# Patient Record
Sex: Female | Born: 1980 | Hispanic: No | Marital: Married | Smoking: Never smoker
Health system: Southern US, Community
[De-identification: ages and names within clinical notes are randomized; demographics above are authoritative.]

## PROBLEM LIST (undated history)

## (undated) ENCOUNTER — Inpatient Hospital Stay (HOSPITAL_COMMUNITY): Payer: Self-pay

## (undated) DIAGNOSIS — T4145XA Adverse effect of unspecified anesthetic, initial encounter: Secondary | ICD-10-CM

## (undated) DIAGNOSIS — Z789 Other specified health status: Secondary | ICD-10-CM

## (undated) DIAGNOSIS — T8859XA Other complications of anesthesia, initial encounter: Secondary | ICD-10-CM

## (undated) HISTORY — DX: Other complications of anesthesia, initial encounter: T88.59XA

## (undated) HISTORY — DX: Adverse effect of unspecified anesthetic, initial encounter: T41.45XA

---

## 2016-09-22 ENCOUNTER — Encounter (HOSPITAL_COMMUNITY): Payer: Self-pay

## 2016-09-22 ENCOUNTER — Inpatient Hospital Stay (HOSPITAL_COMMUNITY)
Admission: AD | Admit: 2016-09-22 | Discharge: 2016-09-22 | Disposition: A | Discharge status: Home / Self Care | Payer: Self-pay | Source: Ambulatory Visit | Attending: Obstetrics & Gynecology | Admitting: Obstetrics & Gynecology

## 2016-09-22 DIAGNOSIS — O26893 Other specified pregnancy related conditions, third trimester: Secondary | ICD-10-CM | POA: Insufficient documentation

## 2016-09-22 DIAGNOSIS — M545 Low back pain: Secondary | ICD-10-CM | POA: Insufficient documentation

## 2016-09-22 DIAGNOSIS — O34219 Maternal care for unspecified type scar from previous cesarean delivery: Secondary | ICD-10-CM

## 2016-09-22 DIAGNOSIS — O34211 Maternal care for low transverse scar from previous cesarean delivery: Secondary | ICD-10-CM | POA: Insufficient documentation

## 2016-09-22 DIAGNOSIS — O9989 Other specified diseases and conditions complicating pregnancy, childbirth and the puerperium: Secondary | ICD-10-CM

## 2016-09-22 DIAGNOSIS — M549 Dorsalgia, unspecified: Secondary | ICD-10-CM

## 2016-09-22 DIAGNOSIS — O99891 Other specified diseases and conditions complicating pregnancy: Secondary | ICD-10-CM

## 2016-09-22 DIAGNOSIS — Z3A30 30 weeks gestation of pregnancy: Secondary | ICD-10-CM | POA: Insufficient documentation

## 2016-09-22 HISTORY — DX: Other specified health status: Z78.9

## 2016-09-22 LAB — URINALYSIS, ROUTINE W REFLEX MICROSCOPIC
Bilirubin Urine: NEGATIVE
GLUCOSE, UA: NEGATIVE mg/dL
Hgb urine dipstick: NEGATIVE
KETONES UR: NEGATIVE mg/dL
LEUKOCYTES UA: NEGATIVE
NITRITE: NEGATIVE
Protein, ur: NEGATIVE mg/dL
Specific Gravity, Urine: 1.025 (ref 1.005–1.030)
pH: 5 (ref 5.0–8.0)

## 2016-09-22 NOTE — MAU Note (Addendum)
G4P3 @ 30 wks. Previous c/s x3 "cervix thick" according to patient. Denies LOF or bleeding. Presents to triage for back pain for 4 days. VSS see flow sheet for details Urine result pending and sent to lab by OB tech  1937: Provider at bs for SVE with OB tech. SVE closed.

## 2016-09-22 NOTE — Discharge Instructions (Signed)
Back Pain in Pregnancy Introduction Back pain during pregnancy is common. Back pain may be caused by several factors that are related to changes during your pregnancy. Follow these instructions at home: Managing pain, stiffness, and swelling  If directed, apply ice for sudden (acute) back pain.  Put ice in a plastic bag.  Place a towel between your skin and the bag.  Leave the ice on for 20 minutes, 2-3 times per day.  If directed, apply heat to the affected area before you exercise:  Place a towel between your skin and the heat pack or heating pad.  Leave the heat on for 20-30 minutes.  Remove the heat if your skin turns bright red. This is especially important if you are unable to feel pain, heat, or cold. You may have a greater risk of getting burned. Activity  Exercise as told by your health care provider. Exercising is the best way to prevent or manage back pain.  Listen to your body when lifting. If lifting hurts, ask for help or bend your knees. This uses your leg muscles instead of your back muscles.  Squat down when picking up something from the floor. Do not bend over.  Only use bed rest as told by your health care provider. Bed rest should only be used for the most severe episodes of back pain. Standing, Sitting, and Lying Down  Do not stand in one place for long periods of time.  Use good posture when sitting. Make sure your head rests over your shoulders and is not hanging forward. Use a pillow on your lower back if necessary.  Try sleeping on your side, preferably the left side, with a pillow or two between your legs. If you are sore after a night's rest, your bed may be too soft. A firm mattress may provide more support for your back during pregnancy. General instructions  Do not wear high heels.  Eat a healthy diet. Try to gain weight within your health care provider's recommendations.  Use a maternity girdle, elastic sling, or back brace as told by your  health care provider.  Take over-the-counter and prescription medicines only as told by your health care provider.  Keep all follow-up visits as told by your health care provider. This is important. This includes any visits with any specialists, such as a physical therapist. Contact a health care provider if:  Your back pain interferes with your daily activities.  You have increasing pain in other parts of your body. Get help right away if:  You develop numbness, tingling, weakness, or problems with the use of your arms or legs.  You develop severe back pain that is not controlled with medicine.  You have a sudden change in bowel or bladder control.  You develop shortness of breath, dizziness, or you faint.  You develop nausea, vomiting, or sweating.  You have back pain that is a rhythmic, cramping pain similar to labor pains. Labor pain is usually 1-2 minutes apart, lasts for about 1 minute, and involves a bearing down feeling or pressure in your pelvis.  You have back pain and your water breaks or you have vaginal bleeding.  You have back pain or numbness that travels down your leg.  Your back pain developed after you fell.  You develop pain on one side of your back.  You see blood in your urine.  You develop skin blisters in the area of your back pain. This information is not intended to replace advice given to   you by your health care provider. Make sure you discuss any questions you have with your health care provider. Document Released: 01/09/2006 Document Revised: 03/08/2016 Document Reviewed: 06/15/2015  2017 [employer]  

## 2016-09-22 NOTE — MAU Provider Note (Signed)
History   G4P3003 @ 30 wks by LMP in for low back pain that has been goin on for 4 days. Has not had PNC in this country.recently here from [place]. Denies vag bleeding or ROM.  CSN: [id number]  Arrival date & time 09/22/16  1848   None     Chief Complaint  Patient presents with  . Back Pain    4 days     HPI  History reviewed. No pertinent past medical history.  No past surgical history on file.  No family history on file.  Social History  Substance Use Topics  . Smoking status: Not on file  . Smokeless tobacco: Not on file  . Alcohol use Not on file    OB History    Gravida Para Term Preterm AB Living   4 3 3     3    SAB TAB Ectopic Multiple Live Births           3      Review of Systems  Constitutional: Negative.   HENT: Negative.   Eyes: Negative.   Respiratory: Negative.   Cardiovascular: Negative.   Gastrointestinal: Negative.   Endocrine: Negative.   Genitourinary: Negative.   Musculoskeletal: Positive for back pain.  Skin: Negative.   Allergic/Immunologic: Negative.   Neurological: Negative.   Hematological: Negative.   Psychiatric/Behavioral: Negative.     Allergies  Patient has no known allergies.  Home Medications    BP 119/69 (BP Location: Right Arm)   Pulse 72   Temp 97.9 F (36.6 C) (Oral)   Resp 18   LMP 02/19/2016   Physical Exam  Constitutional: She is oriented to person, place, and time. She appears well-developed and well-nourished.  HENT:  Head: Normocephalic.  Cardiovascular: Normal rate, regular rhythm, normal heart sounds and intact distal pulses.   Pulmonary/Chest: Effort normal and breath sounds normal.  Abdominal: Soft. Bowel sounds are normal.  Genitourinary: Vagina normal and uterus normal.  Musculoskeletal: Normal range of motion.  Neurological: She is alert and oriented to person, place, and time. She has normal reflexes.  Skin: Skin is warm and dry.  Psychiatric: She has a normal mood and affect. Her behavior  is normal. Judgment and thought content normal.    MAU Course  Procedures (including critical care time)  Labs Reviewed  URINALYSIS, ROUTINE W REFLEX MICROSCOPIC   No results found.   No diagnosis found.  Dx: back pain in preg  MDM  Assessment: preg at 30 wks  [place] care in this [place] Prev c/s x 2 Back pain in preg SVE cl/th/post/high. FHR pattern reassuring.no uc's noted. Will send message to clinic to get pt in ASAP for care and evaluation. D/C home

## 2016-10-24 ENCOUNTER — Ambulatory Visit (INDEPENDENT_AMBULATORY_CARE_PROVIDER_SITE_OTHER): Payer: Self-pay | Admitting: Family Medicine

## 2016-10-24 ENCOUNTER — Encounter: Payer: Self-pay | Admitting: Family Medicine

## 2016-10-24 VITALS — BP 116/79 | HR 78 | Wt 230.6 lb

## 2016-10-24 DIAGNOSIS — Z113 Encounter for screening for infections with a predominantly sexual mode of transmission: Secondary | ICD-10-CM

## 2016-10-24 DIAGNOSIS — Z3483 Encounter for supervision of other normal pregnancy, third trimester: Secondary | ICD-10-CM

## 2016-10-24 DIAGNOSIS — Z3493 Encounter for supervision of normal pregnancy, unspecified, third trimester: Secondary | ICD-10-CM | POA: Insufficient documentation

## 2016-10-24 DIAGNOSIS — O34219 Maternal care for unspecified type scar from previous cesarean delivery: Secondary | ICD-10-CM

## 2016-10-24 DIAGNOSIS — Z603 Acculturation difficulty: Secondary | ICD-10-CM

## 2016-10-24 DIAGNOSIS — Z23 Encounter for immunization: Secondary | ICD-10-CM

## 2016-10-24 DIAGNOSIS — Z3480 Encounter for supervision of other normal pregnancy, unspecified trimester: Secondary | ICD-10-CM

## 2016-10-24 DIAGNOSIS — O09529 Supervision of elderly multigravida, unspecified trimester: Secondary | ICD-10-CM | POA: Insufficient documentation

## 2016-10-24 DIAGNOSIS — Z789 Other specified health status: Secondary | ICD-10-CM

## 2016-10-24 DIAGNOSIS — O09523 Supervision of elderly multigravida, third trimester: Secondary | ICD-10-CM

## 2016-10-24 LAB — POCT URINALYSIS DIP (DEVICE)
BILIRUBIN URINE: NEGATIVE
GLUCOSE, UA: NEGATIVE mg/dL
Hgb urine dipstick: NEGATIVE
KETONES UR: NEGATIVE mg/dL
Leukocytes, UA: NEGATIVE
Nitrite: NEGATIVE
Protein, ur: NEGATIVE mg/dL
Specific Gravity, Urine: 1.02 (ref 1.005–1.030)
Urobilinogen, UA: 1 mg/dL (ref 0.0–1.0)
pH: 6.5 (ref 5.0–8.0)

## 2016-10-24 NOTE — Patient Instructions (Signed)
Third Trimester of Pregnancy The third trimester is from week 29 through week 40 (months 7 through 9). The third trimester is a time when the unborn baby (fetus) is growing rapidly. At the end of the ninth month, the fetus is about 20 inches in length and weighs 6-10 pounds. Body changes during your third trimester Your body goes through many changes during pregnancy. The changes vary from woman to woman. During the third trimester:  Your weight will continue to increase. You can expect to gain 25-35 pounds (11-16 kg) by the end of the pregnancy.  You may begin to get stretch marks on your hips, abdomen, and breasts.  You may urinate more often because the fetus is moving lower into your pelvis and pressing on your bladder.  You may develop or continue to have heartburn. This is caused by increased hormones that slow down muscles in the digestive tract.  You may develop or continue to have constipation because increased hormones slow digestion and cause the muscles that push waste through your intestines to relax.  You may develop hemorrhoids. These are swollen veins (varicose veins) in the rectum that can itch or be painful.  You may develop swollen, bulging veins (varicose veins) in your legs.  You may have increased body aches in the pelvis, back, or thighs. This is due to weight gain and increased hormones that are relaxing your joints.  You may have changes in your hair. These can include thickening of your hair, rapid growth, and changes in texture. Some women also have hair loss during or after pregnancy, or hair that feels dry or thin. Your hair will most likely return to normal after your baby is born.  Your breasts will continue to grow and they will continue to become tender. A yellow fluid (colostrum) may leak from your breasts. This is the first milk you are producing for your baby.  Your belly button may stick out.  You may notice more swelling in your hands, face, or  ankles.  You may have increased tingling or numbness in your hands, arms, and legs. The skin on your belly may also feel numb.  You may feel short of breath because of your expanding uterus.  You may have more problems sleeping. This can be caused by the size of your belly, increased need to urinate, and an increase in your body's metabolism.  You may notice the fetus "dropping," or moving lower in your abdomen.  You may have increased vaginal discharge.  Your cervix becomes thin and soft (effaced) near your due date. What to expect at prenatal visits You will have prenatal exams every 2 weeks until week 36. Then you will have weekly prenatal exams. During a routine prenatal visit:  You will be weighed to make sure you and the fetus are growing normally.  Your blood pressure will be taken.  Your abdomen will be measured to track your baby's growth.  The fetal heartbeat will be listened to.  Any test results from the previous visit will be discussed.  You may have a cervical check near your due date to see if you have effaced. At around 36 weeks, your health care provider will check your cervix. At the same time, your health care provider will also perform a test on the secretions of the vaginal tissue. This test is to determine if a type of bacteria, Group B streptococcus, is present. Your health care provider will explain this further. Your health care provider may ask you:    What your birth plan is.  How you are feeling.  If you are feeling the baby move.  If you have had any abnormal symptoms, such as leaking fluid, bleeding, severe headaches, or abdominal cramping.  If you are using any tobacco products, including cigarettes, chewing tobacco, and electronic cigarettes.  If you have any questions. Other tests or screenings that may be performed during your third trimester include:  Blood tests that check for low iron levels (anemia).  Fetal testing to check the health,  activity level, and growth of the fetus. Testing is done if you have certain medical conditions or if there are problems during the pregnancy.  Nonstress test (NST). This test checks the health of your baby to make sure there are no signs of problems, such as the baby not getting enough oxygen. During this test, a belt is placed around your belly. The baby is made to move, and its heart rate is monitored during movement. What is false labor? False labor is a condition in which you feel small, irregular tightenings of the muscles in the womb (contractions) that eventually go away. These are called Braxton Hicks contractions. Contractions may last for hours, days, or even weeks before true labor sets in. If contractions come at regular intervals, become more frequent, increase in intensity, or become painful, you should see your health care provider. What are the signs of labor?  Abdominal cramps.  Regular contractions that start at 10 minutes apart and become stronger and more frequent with time.  Contractions that start on the top of the uterus and spread down to the lower abdomen and back.  Increased pelvic pressure and dull back pain.  A watery or bloody mucus discharge that comes from the vagina.  Leaking of amniotic fluid. This is also known as your "water breaking." It could be a slow trickle or a gush. Let your doctor know if it has a color or strange odor. If you have any of these signs, call your health care provider right away, even if it is before your due date. Follow these instructions at home: Eating and drinking  Continue to eat regular, healthy meals.  Do not eat:  Raw meat or meat spreads.  Unpasteurized milk or cheese.  Unpasteurized juice.  Store-made salad.  Refrigerated smoked seafood.  Hot dogs or deli meat, unless they are piping hot.  More than 6 ounces of albacore tuna a week.  Shark, swordfish, king mackerel, or tile fish.  Store-made salads.  Raw  sprouts, such as mung bean or alfalfa sprouts.  Take prenatal vitamins as told by your health care provider.  Take 1000 mg of calcium daily as told by your health care provider.  If you develop constipation:  Take over-the-counter or prescription medicines.  Drink enough fluid to keep your urine clear or pale yellow.  Eat foods that are high in fiber, such as fresh fruits and vegetables, whole grains, and beans.  Limit foods that are high in fat and processed sugars, such as fried and sweet foods. Activity  Exercise only as directed by your health care provider. Healthy pregnant women should aim for 2 hours and 30 minutes of moderate exercise per week. If you experience any pain or discomfort while exercising, stop.  Avoid heavy lifting.  Do not exercise in extreme heat or humidity, or at high altitudes.  Wear low-heel, comfortable shoes.  Practice good posture.  Do not travel far distances unless it is absolutely necessary and only with the approval   of your health care provider.  Wear your seat belt at all times while in a car, on a bus, or on a plane.  Take frequent breaks and rest with your legs elevated if you have leg cramps or low back pain.  Do not use hot tubs, steam rooms, or saunas.  You may continue to have sex unless your health care provider tells you otherwise. Lifestyle  Do not use any products that contain nicotine or tobacco, such as cigarettes and e-cigarettes. If you need help quitting, ask your health care provider.  Do not drink alcohol.  Do not use any medicinal herbs or unprescribed drugs. These chemicals affect the formation and growth of the baby.  If you develop varicose veins:  Wear support pantyhose or compression stockings as told by your healthcare provider.  Elevate your feet for 15 minutes, 3-4 times a day.  Wear a supportive maternity bra to help with breast tenderness. General instructions  Take over-the-counter and prescription  medicines only as told by your health care provider. There are medicines that are either safe or unsafe to take during pregnancy.  Take warm sitz baths to soothe any pain or discomfort caused by hemorrhoids. Use hemorrhoid cream or witch hazel if your health care provider approves.  Avoid cat litter boxes and soil used by cats. These carry germs that can cause birth defects in the baby. If you have a cat, ask someone to clean the litter box for you.  To prepare for the arrival of your baby:  Take prenatal classes to understand, practice, and ask questions about the labor and delivery.  Make a trial run to the hospital.  Visit the hospital and tour the maternity area.  Arrange for maternity or paternity leave through employers.  Arrange for family and friends to take care of pets while you are in the hospital.  Purchase a rear-facing car seat and make sure you know how to install it in your car.  Pack your hospital bag.  Prepare the baby's nursery. Make sure to remove all pillows and stuffed animals from the baby's crib to prevent suffocation.  Visit your dentist if you have not gone during your pregnancy. Use a soft toothbrush to brush your teeth and be gentle when you floss.  Keep all prenatal follow-up visits as told by your health care provider. This is important. Contact a health care provider if:  You are unsure if you are in labor or if your water has broken.  You become dizzy.  You have mild pelvic cramps, pelvic pressure, or nagging pain in your abdominal area.  You have lower back pain.  You have persistent nausea, vomiting, or diarrhea.  You have an unusual or bad smelling vaginal discharge.  You have pain when you urinate. Get help right away if:  You have a fever.  You are leaking fluid from your vagina.  You have spotting or bleeding from your vagina.  You have severe abdominal pain or cramping.  You have rapid weight loss or weight gain.  You have  shortness of breath with chest pain.  You notice sudden or extreme swelling of your face, hands, ankles, feet, or legs.  Your baby makes fewer than 10 movements in 2 hours.  You have severe headaches that do not go away with medicine.  You have vision changes. Summary  The third trimester is from week 29 through week 40, months 7 through 9. The third trimester is a time when the unborn baby (fetus)   is growing rapidly.  During the third trimester, your discomfort may increase as you and your baby continue to gain weight. You may have abdominal, leg, and back pain, sleeping problems, and an increased need to urinate.  During the third trimester your breasts will keep growing and they will continue to become tender. A yellow fluid (colostrum) may leak from your breasts. This is the first milk you are producing for your baby.  False labor is a condition in which you feel small, irregular tightenings of the muscles in the womb (contractions) that eventually go away. These are called Braxton Hicks contractions. Contractions may last for hours, days, or even weeks before true labor sets in.  Signs of labor can include: abdominal cramps; regular contractions that start at 10 minutes apart and become stronger and more frequent with time; watery or bloody mucus discharge that comes from the vagina; increased pelvic pressure and dull back pain; and leaking of amniotic fluid. This information is not intended to replace advice given to you by your health care provider. Make sure you discuss any questions you have with your health care provider. Document Released: 09/25/2001 Document Revised: 03/08/2016 Document Reviewed: 12/02/2012 [employer] Interactive Patient Education  2017 [employer]   Breastfeeding Deciding to breastfeed is one of the best choices you can make for you and your baby. A change in hormones during pregnancy causes your breast tissue to grow and increases the number and size of your  milk ducts. These hormones also allow proteins, sugars, and fats from your blood supply to make breast milk in your milk-producing glands. Hormones prevent breast milk from being released before your baby is born as well as prompt milk flow after birth. Once breastfeeding has begun, thoughts of your baby, as well as his or her sucking or crying, can stimulate the release of milk from your milk-producing glands. Benefits of breastfeeding For Your Baby  Your first milk (colostrum) helps your baby's digestive system function better.  There are antibodies in your milk that help your baby fight off infections.  Your baby has a lower incidence of asthma, allergies, and sudden infant death syndrome.  The nutrients in breast milk are better for your baby than infant formulas and are designed uniquely for your baby's needs.  Breast milk improves your baby's brain development.  Your baby is less likely to develop other conditions, such as childhood obesity, asthma, or type 2 diabetes mellitus. For You  Breastfeeding helps to create a very special bond between you and your baby.  Breastfeeding is convenient. Breast milk is always available at the correct temperature and costs nothing.  Breastfeeding helps to burn calories and helps you lose the weight gained during pregnancy.  Breastfeeding makes your uterus contract to its prepregnancy size faster and slows bleeding (lochia) after you give birth.  Breastfeeding helps to lower your risk of developing type 2 diabetes mellitus, osteoporosis, and breast or ovarian cancer later in life. Signs that your baby is hungry Early Signs of Hunger  Increased alertness or activity.  Stretching.  Movement of the head from side to side.  Movement of the head and opening of the mouth when the corner of the mouth or cheek is stroked (rooting).  Increased sucking sounds, smacking lips, cooing, sighing, or squeaking.  Hand-to-mouth movements.  Increased  sucking of fingers or hands. Late Signs of Hunger  Fussing.  Intermittent crying. Extreme Signs of Hunger  Signs of extreme hunger will require calming and consoling before your baby will   be able to breastfeed successfully. Do not wait for the following signs of extreme hunger to occur before you initiate breastfeeding:  Restlessness.  A loud, strong cry.  Screaming. Breastfeeding basics  Breastfeeding Initiation  Find a comfortable place to sit or lie down, with your neck and back well supported.  Place a pillow or rolled up blanket under your baby to bring him or her to the level of your breast (if you are seated). Nursing pillows are specially designed to help support your arms and your baby while you breastfeed.  Make sure that your baby's abdomen is facing your abdomen.  Gently massage your breast. With your fingertips, massage from your chest wall toward your nipple in a circular motion. This encourages milk flow. You may need to continue this action during the feeding if your milk flows slowly.  Support your breast with 4 fingers underneath and your thumb above your nipple. Make sure your fingers are well away from your nipple and your baby's mouth.  Stroke your baby's lips gently with your finger or nipple.  When your baby's mouth is open wide enough, quickly bring your baby to your breast, placing your entire nipple and as much of the colored area around your nipple (areola) as possible into your baby's mouth.  More areola should be visible above your baby's upper lip than below the lower lip.  Your baby's tongue should be between his or her lower gum and your breast.  Ensure that your baby's mouth is correctly positioned around your nipple (latched). Your baby's lips should create a seal on your breast and be turned out (everted).  It is common for your baby to suck about 2-3 minutes in order to start the flow of breast milk. Latching  Teaching your baby how to latch  on to your breast properly is very important. An improper latch can cause nipple pain and decreased milk supply for you and poor weight gain in your baby. Also, if your baby is not latched onto your nipple properly, he or she may swallow some air during feeding. This can make your baby fussy. Burping your baby when you switch breasts during the feeding can help to get rid of the air. However, teaching your baby to latch on properly is still the best way to prevent fussiness from swallowing air while breastfeeding. Signs that your baby has successfully latched on to your nipple:  Silent tugging or silent sucking, without causing you pain.  Swallowing heard between every 3-4 sucks.  Muscle movement above and in front of his or her ears while sucking. Signs that your baby has not successfully latched on to nipple:  Sucking sounds or smacking sounds from your baby while breastfeeding.  Nipple pain. If you think your baby has not latched on correctly, slip your finger into the corner of your baby's mouth to break the suction and place it between your baby's gums. Attempt breastfeeding initiation again. Signs of Successful Breastfeeding  Signs from your baby:  A gradual decrease in the number of sucks or complete cessation of sucking.  Falling asleep.  Relaxation of his or her body.  Retention of a small amount of milk in his or her mouth.  Letting go of your breast by himself or herself. Signs from you:  Breasts that have increased in firmness, weight, and size 1-3 hours after feeding.  Breasts that are softer immediately after breastfeeding.  Increased milk volume, as well as a change in milk consistency and color by   the fifth day of breastfeeding.  Nipples that are not sore, cracked, or bleeding. Signs That Your Baby is Getting Enough Milk  Wetting at least 1-2 diapers during the first 24 hours after birth.  Wetting at least 5-6 diapers every 24 hours for the first week after  birth. The urine should be clear or pale yellow by 5 days after birth.  Wetting 6-8 diapers every 24 hours as your baby continues to grow and develop.  At least 3 stools in a 24-hour period by age 5 days. The stool should be soft and yellow.  At least 3 stools in a 24-hour period by age 7 days. The stool should be seedy and yellow.  No loss of weight greater than 10% of birth weight during the first 3 days of age.  Average weight gain of 4-7 ounces (113-198 g) per week after age 4 days.  Consistent daily weight gain by age 5 days, without weight loss after the age of 2 weeks. After a feeding, your baby may spit up a small amount. This is common. Breastfeeding frequency and duration Frequent feeding will help you make more milk and can prevent sore nipples and breast engorgement. Breastfeed when you feel the need to reduce the fullness of your breasts or when your baby shows signs of hunger. This is called "breastfeeding on demand." Avoid introducing a pacifier to your baby while you are working to establish breastfeeding (the first 4-6 weeks after your baby is born). After this time you may choose to use a pacifier. Research has shown that pacifier use during the first year of a baby's life decreases the risk of sudden infant death syndrome (SIDS). Allow your baby to feed on each breast as long as he or she wants. Breastfeed until your baby is finished feeding. When your baby unlatches or falls asleep while feeding from the first breast, offer the second breast. Because newborns are often sleepy in the first few weeks of life, you may need to awaken your baby to get him or her to feed. Breastfeeding times will vary from baby to baby. However, the following rules can serve as a guide to help you ensure that your baby is properly fed:  Newborns (babies 4 weeks of age or younger) may breastfeed every 1-3 hours.  Newborns should not go longer than 3 hours during the day or 5 hours during the night  without breastfeeding.  You should breastfeed your baby a minimum of 8 times in a 24-hour period until you begin to introduce solid foods to your baby at around 6 months of age. Breast milk pumping Pumping and storing breast milk allows you to ensure that your baby is exclusively fed your breast milk, even at times when you are unable to breastfeed. This is especially important if you are going back to work while you are still breastfeeding or when you are not able to be present during feedings. Your lactation consultant can give you guidelines on how long it is safe to store breast milk. A breast pump is a machine that allows you to pump milk from your breast into a sterile bottle. The pumped breast milk can then be stored in a refrigerator or freezer. Some breast pumps are operated by hand, while others use electricity. Ask your lactation consultant which type will work best for you. Breast pumps can be purchased, but some hospitals and breastfeeding support groups lease breast pumps on a monthly basis. A lactation consultant can teach you how to   hand express breast milk, if you prefer not to use a pump. Caring for your breasts while you breastfeed Nipples can become dry, cracked, and sore while breastfeeding. The following recommendations can help keep your breasts moisturized and healthy:  Avoid using soap on your nipples.  Wear a supportive bra. Although not required, special nursing bras and tank tops are designed to allow access to your breasts for breastfeeding without taking off your entire bra or top. Avoid wearing underwire-style bras or extremely tight bras.  Air dry your nipples for 3-4minutes after each feeding.  Use only cotton bra pads to absorb leaked breast milk. Leaking of breast milk between feedings is normal.  Use lanolin on your nipples after breastfeeding. Lanolin helps to maintain your skin's normal moisture barrier. If you use pure lanolin, you do not need to wash it off  before feeding your baby again. Pure lanolin is not toxic to your baby. You may also hand express a few drops of breast milk and gently massage that milk into your nipples and allow the milk to air dry. In the first few weeks after giving birth, some women experience extremely full breasts (engorgement). Engorgement can make your breasts feel heavy, warm, and tender to the touch. Engorgement peaks within 3-5 days after you give birth. The following recommendations can help ease engorgement:  Completely empty your breasts while breastfeeding or pumping. You may want to start by applying warm, moist heat (in the shower or with warm water-soaked hand towels) just before feeding or pumping. This increases circulation and helps the milk flow. If your baby does not completely empty your breasts while breastfeeding, pump any extra milk after he or she is finished.  Wear a snug bra (nursing or regular) or tank top for 1-2 days to signal your body to slightly decrease milk production.  Apply ice packs to your breasts, unless this is too uncomfortable for you.  Make sure that your baby is latched on and positioned properly while breastfeeding. If engorgement persists after 48 hours of following these recommendations, contact your health care provider or a lactation consultant. Overall health care recommendations while breastfeeding  Eat healthy foods. Alternate between meals and snacks, eating 3 of each per day. Because what you eat affects your breast milk, some of the foods may make your baby more irritable than usual. Avoid eating these foods if you are sure that they are negatively affecting your baby.  Drink milk, fruit juice, and water to satisfy your thirst (about 10 glasses a day).  Rest often, relax, and continue to take your prenatal vitamins to prevent fatigue, stress, and anemia.  Continue breast self-awareness checks.  Avoid chewing and smoking tobacco. Chemicals from cigarettes that pass  into breast milk and exposure to secondhand smoke may harm your baby.  Avoid alcohol and drug use, including marijuana. Some medicines that may be harmful to your baby can pass through breast milk. It is important to ask your health care provider before taking any medicine, including all over-the-counter and prescription medicine as well as vitamin and herbal supplements. It is possible to become pregnant while breastfeeding. If birth control is desired, ask your health care provider about options that will be safe for your baby. Contact a health care provider if:  You feel like you want to stop breastfeeding or have become frustrated with breastfeeding.  You have painful breasts or nipples.  Your nipples are cracked or bleeding.  Your breasts are red, tender, or warm.  You have   a swollen area on either breast.  You have a fever or chills.  You have nausea or vomiting.  You have drainage other than breast milk from your nipples.  Your breasts do not become full before feedings by the fifth day after you give birth.  You feel sad and depressed.  Your baby is too sleepy to eat well.  Your baby is having trouble sleeping.  Your baby is wetting less than 3 diapers in a 24-hour period.  Your baby has less than 3 stools in a 24-hour period.  Your baby's skin or the white part of his or her eyes becomes yellow.  Your baby is not gaining weight by 5 days of age. Get help right away if:  Your baby is overly tired (lethargic) and does not want to wake up and feed.  Your baby develops an unexplained fever. This information is not intended to replace advice given to you by your health care provider. Make sure you discuss any questions you have with your health care provider. Document Released: 10/01/2005 Document Revised: 03/14/2016 Document Reviewed: 03/25/2013 Elsevier Interactive Patient Education  2017 Elsevier Inc.  

## 2016-10-24 NOTE — Progress Notes (Signed)
   Subjective:    Frederic JerichoSamah Tome is a [id number] 10334w3d being seen today for her first obstetrical visit.  Her obstetrical history is significant for advanced maternal age. Patient does intend to breast feed. Pregnancy history fully reviewed.  Patient reports no complaints.  Vitals:   10/24/16 1320  BP: 116/79  Pulse: 78  Weight: 230 lb 9.6 oz (104.6 kg)    HISTORY: OB History  Gravida Para Term Preterm AB Living  4 3 3     3   SAB TAB Ectopic Multiple Live Births          3    # Outcome Date GA Lbr Len/2nd Weight Sex Delivery Anes PTL Lv  4 Current           3 Term 782015    M CS-LTranv  N LIV  2 Term     F CS-LTranv  Y LIV  1 Term     M CS-LTranv  N LIV     Past Medical History:  Diagnosis Date  . Complication of anesthesia    intolerance of large amts of anesthesia  . Medical history non-contributory    Past Surgical History:  Procedure Laterality Date  . CESAREAN SECTION     2012, 2013, 2015   History reviewed. No pertinent family history.   Exam    Uterus:   FH 34  Pelvic Exam:    Perineum: Normal Perineum  System: Breast:  normal appearance, no masses or tenderness   Skin: normal coloration and turgor, no rashes    Neurologic: normal, normal mood   Extremities: normal strength, tone, and muscle mass   HEENT extra ocular movement intact and sclera clear, anicteric   Mouth/Teeth mucous membranes moist, pharynx normal without lesions   Neck no masses   Cardiovascular: regular rate and rhythm, no murmurs or gallops   Respiratory:  appears well, vitals normal, no respiratory distress, acyanotic, normal RR, ear and throat exam is normal, neck free of mass or lymphadenopathy, chest clear, no wheezing, crepitations, rhonchi, normal symmetric air entry   Abdomen: gravid, NT      Assessment/Plan:  1. Needs flu shot - Flu Vaccine QUAD 36+ mos IM (Fluarix, Quad PF)  2. Need for Tdap vaccination - Tdap vaccine greater than or equal to 7yo IM  3. Supervision of  other normal pregnancy, antepartum New OB labs--u/s for anatomy - Prenatal Profile - Hemoglobinopathy Evaluation - GC/Chlamydia probe amp ([facility] - Culture, beta strep (group b only) - US MFM OB COMP + 14 WK; Future - Culture, OB Urine - Pain Mgmt, Profile 6 Conf w/o mM, U   4. Previous cesarean section complicating pregnancy, antepartum condition or complication X 3--scheduled for repeat at 39wks.  5. Elderly multigravida in third trimester [place] in [place]    Mirabelle Cyphers S Jahmiyah Dullea 10/24/2016

## 2016-10-24 NOTE — Progress Notes (Signed)
Flu/Tdap Schedule u/s All prenatal labs

## 2016-10-25 ENCOUNTER — Ambulatory Visit (HOSPITAL_COMMUNITY)
Admission: RE | Admit: 2016-10-25 | Discharge: 2016-10-25 | Disposition: A | Discharge status: Home / Self Care | Payer: Self-pay | Source: Ambulatory Visit | Attending: Family Medicine | Admitting: Family Medicine

## 2016-10-25 ENCOUNTER — Other Ambulatory Visit: Payer: Self-pay | Admitting: Family Medicine

## 2016-10-25 DIAGNOSIS — Z3480 Encounter for supervision of other normal pregnancy, unspecified trimester: Secondary | ICD-10-CM

## 2016-10-25 DIAGNOSIS — Z3A35 35 weeks gestation of pregnancy: Secondary | ICD-10-CM

## 2016-10-25 DIAGNOSIS — O34211 Maternal care for low transverse scar from previous cesarean delivery: Secondary | ICD-10-CM | POA: Insufficient documentation

## 2016-10-25 DIAGNOSIS — O09523 Supervision of elderly multigravida, third trimester: Secondary | ICD-10-CM

## 2016-10-25 DIAGNOSIS — Z363 Encounter for antenatal screening for malformations: Secondary | ICD-10-CM | POA: Insufficient documentation

## 2016-10-25 DIAGNOSIS — O34219 Maternal care for unspecified type scar from previous cesarean delivery: Secondary | ICD-10-CM

## 2016-10-25 LAB — PRENATAL PROFILE (SOLSTAS)
ANTIBODY SCREEN: NEGATIVE
BASOS PCT: 0 %
Basophils Absolute: 0 cells/uL (ref 0–200)
EOS ABS: 64 {cells}/uL (ref 15–500)
Eosinophils Relative: 1 %
HCT: 35.5 % (ref 35.0–45.0)
HEMOGLOBIN: 11.8 g/dL (ref 11.7–15.5)
HIV 1&2 Ab, 4th Generation: NONREACTIVE
Hepatitis B Surface Ag: NEGATIVE
LYMPHS ABS: 2048 {cells}/uL (ref 850–3900)
Lymphocytes Relative: 32 %
MCH: 28.4 pg (ref 27.0–33.0)
MCHC: 33.2 g/dL (ref 32.0–36.0)
MCV: 85.5 fL (ref 80.0–100.0)
MONOS PCT: 12 %
MPV: 9.6 fL (ref 7.5–12.5)
Monocytes Absolute: 768 cells/uL (ref 200–950)
NEUTROS ABS: 3520 {cells}/uL (ref 1500–7800)
Neutrophils Relative %: 55 %
PLATELETS: 250 10*3/uL (ref 140–400)
RBC: 4.15 MIL/uL (ref 3.80–5.10)
RDW: 14.2 % (ref 11.0–15.0)
Rh Type: POSITIVE
Rubella: 8.41 Index — ABNORMAL HIGH (ref <0.90)
WBC: 6.4 10*3/uL (ref 3.8–10.8)

## 2016-10-25 LAB — PAIN MGMT, PROFILE 6 CONF W/O MM, U
6 ACETYLMORPHINE: NEGATIVE ng/mL (ref <10)
AMPHETAMINES: NEGATIVE ng/mL (ref <500)
Alcohol Metabolites: NEGATIVE ng/mL (ref <500)
BARBITURATES: NEGATIVE ng/mL (ref <300)
Benzodiazepines: NEGATIVE ng/mL (ref <100)
COCAINE METABOLITE: NEGATIVE ng/mL (ref <150)
Creatinine: 167.6 mg/dL (ref >=20.0)
Marijuana Metabolite: NEGATIVE ng/mL (ref <20)
Methadone Metabolite: NEGATIVE ng/mL (ref <100)
OXYCODONE: NEGATIVE ng/mL (ref <100)
Opiates: NEGATIVE ng/mL (ref <100)
Oxidant: NEGATIVE ug/mL (ref <200)
Phencyclidine: NEGATIVE ng/mL (ref <25)
Please note:: 0
pH: 6.71 (ref 4.5–9.0)

## 2016-10-25 LAB — GC/CHLAMYDIA PROBE AMP (~~LOC~~) NOT AT ARMC
Chlamydia: NEGATIVE
NEISSERIA GONORRHEA: NEGATIVE

## 2016-10-26 LAB — HEMOGLOBINOPATHY EVALUATION
HCT: 35.5 % (ref 35.0–45.0)
HEMOGLOBIN: 11.8 g/dL (ref 11.7–15.5)
HGB A2 QUANT: 2.2 % (ref 1.8–3.5)
Hgb A: 96.8 % (ref >96.0)
Hgb F Quant: <1 % (ref <2.0)
MCH: 28.4 pg (ref 27.0–33.0)
MCV: 85.5 fL (ref 80.0–100.0)
RBC: 4.15 MIL/uL (ref 3.80–5.10)
RDW: 14.2 % (ref 11.0–15.0)

## 2016-10-26 LAB — CULTURE, OB URINE

## 2016-10-26 LAB — CULTURE, BETA STREP (GROUP B ONLY)

## 2016-10-30 ENCOUNTER — Encounter (HOSPITAL_COMMUNITY): Payer: Self-pay

## 2016-11-01 ENCOUNTER — Encounter: Payer: Self-pay | Admitting: General Practice

## 2016-11-01 ENCOUNTER — Encounter: Payer: Self-pay | Admitting: Obstetrics and Gynecology

## 2016-11-01 ENCOUNTER — Encounter: Payer: Self-pay | Admitting: Family

## 2016-11-08 ENCOUNTER — Telehealth (HOSPITAL_COMMUNITY): Payer: Self-pay | Admitting: *Deleted

## 2016-11-08 NOTE — Pre-Procedure Instructions (Signed)
Interpreter number [id number] 

## 2016-11-08 NOTE — Telephone Encounter (Signed)
Preadmission screen  

## 2016-11-12 ENCOUNTER — Other Ambulatory Visit: Payer: Self-pay | Admitting: Family Medicine

## 2016-11-13 ENCOUNTER — Encounter (HOSPITAL_COMMUNITY): Payer: Self-pay

## 2016-11-13 ENCOUNTER — Ambulatory Visit (INDEPENDENT_AMBULATORY_CARE_PROVIDER_SITE_OTHER): Payer: Self-pay | Admitting: Student

## 2016-11-13 VITALS — BP 112/62 | HR 69 | Wt 232.3 lb

## 2016-11-13 DIAGNOSIS — Z3483 Encounter for supervision of other normal pregnancy, third trimester: Secondary | ICD-10-CM

## 2016-11-13 DIAGNOSIS — O09523 Supervision of elderly multigravida, third trimester: Secondary | ICD-10-CM

## 2016-11-13 DIAGNOSIS — O34219 Maternal care for unspecified type scar from previous cesarean delivery: Secondary | ICD-10-CM

## 2016-11-13 NOTE — Progress Notes (Signed)
   PRENATAL VISIT NOTE  Subjective:  Michelle Patel is a 36 y.o. G4P3003 at 4559w2d being seen today for ongoing prenatal care.  She is currently monitored for the following issues for this low-risk pregnancy and has Supervision of normal pregnancy in third trimester; Previous cesarean section complicating pregnancy, antepartum condition or complication; AMA (advanced maternal age) multigravida 35+; and Language barrier on her problem list.  Patient reports no complaints.  Contractions: Not present. Vag. Bleeding: None.  Movement: Present. Denies leaking of fluid.   The following portions of the patient's history were reviewed and updated as appropriate: allergies, current medications, past family history, past medical history, past social history, past surgical history and problem list. Problem list updated.  Objective:   Vitals:   11/13/16 1552  BP: 112/62  Pulse: 69  Weight: 232 lb 4.8 oz (105.4 kg)    Fetal Status: Fetal Heart Rate (bpm): 138 Fundal Height: 36 cm Movement: Present     General:  Alert, oriented and cooperative. Patient is in no acute distress.  Skin: Skin is warm and dry. No rash noted.   Cardiovascular: Normal heart rate noted  Respiratory: Normal respiratory effort, no problems with respiration noted  Abdomen: Soft, gravid, appropriate for gestational age. Pain/Pressure: Absent     Pelvic:  Cervical exam deferred        Extremities: Normal range of motion.  Edema: None  Mental Status: Normal mood and affect. Normal behavior. Normal judgment and thought content.   Assessment and Plan:  Pregnancy: G4P3003 at 4059w2d  1. Encounter for supervision of other normal pregnancy in third trimester Patient feeling well, no complaints.   2. Previous cesarean section complicating pregnancy, antepartum condition or complication Patient spoke with Amy RN regarding pre-op scheduling.   3. Elderly multigravida in third trimester   Term labor symptoms and general obstetric  precautions including but not limited to vaginal bleeding, contractions, leaking of fluid and fetal movement were reviewed in detail with the patient. Please refer to After Visit Summary for other counseling recommendations.  No Follow-up on file.   Marylene LandKathryn Lorraine Kooistra, CNM

## 2016-11-19 ENCOUNTER — Encounter (HOSPITAL_COMMUNITY)
Admission: RE | Admit: 2016-11-19 | Discharge: 2016-11-19 | Disposition: A | Discharge status: Home / Self Care | Payer: Medicaid Other | Source: Ambulatory Visit | Attending: Family Medicine | Admitting: Family Medicine

## 2016-11-19 LAB — CBC
HEMATOCRIT: 36.5 % (ref 36.0–46.0)
Hemoglobin: 12.1 g/dL (ref 12.0–15.0)
MCH: 28.3 pg (ref 26.0–34.0)
MCHC: 33.2 g/dL (ref 30.0–36.0)
MCV: 85.3 fL (ref 78.0–100.0)
PLATELETS: 252 10*3/uL (ref 150–400)
RBC: 4.28 MIL/uL (ref 3.87–5.11)
RDW: 14.8 % (ref 11.5–15.5)
WBC: 6.2 10*3/uL (ref 4.0–10.5)

## 2016-11-19 LAB — RAPID HIV SCREEN (HIV 1/2 AB+AG)
HIV 1/2 Antibodies: NONREACTIVE
HIV-1 P24 Antigen - HIV24: NONREACTIVE

## 2016-11-19 LAB — ABO/RH: ABO/RH(D): O POS

## 2016-11-19 LAB — TYPE AND SCREEN
ABO/RH(D): O POS
Antibody Screen: NEGATIVE

## 2016-11-19 NOTE — Patient Instructions (Signed)
20 Frederic JerichoSamah Despain  11/19/2016   Your procedure is scheduled on:  11/20/2016  [facility] 0945 AM.  Pick up the phone at the desk and dial [phone].   Call this number if you have problems the morning of surgery: [phone]   Remember:   Do not eat food:After Midnight.  Do not drink clear liquids: After Midnight.  Take these medicines the morning of surgery with A SIP OF WATER: none   Do not wear jewelry, make-up or nail polish.  Do not wear lotions, powders, or perfumes. Do not wear deodorant.  Do not shave 48 hours prior to surgery.  Do not bring valuables to the hospital.  [facility] is not   responsible for any belongings or valuables brought to the hospital.  Contacts, dentures or bridgework may not be worn into surgery.  Leave suitcase in the car. After surgery it may be brought to your room.  For patients admitted to the hospital, checkout time is 11:00 AM the day of              discharge.   Patients discharged the day of surgery will not be allowed to drive             home.  Name and phone number of your driver: na  Special Instructions:   N/A   Please read over the following fact sheets that you were given:   Surgical Site Infection Prevention

## 2016-11-20 ENCOUNTER — Inpatient Hospital Stay (HOSPITAL_COMMUNITY): Payer: Medicaid Other | Admitting: Anesthesiology

## 2016-11-20 ENCOUNTER — Inpatient Hospital Stay (HOSPITAL_COMMUNITY)
Admission: RE | Admit: 2016-11-20 | Discharge: 2016-11-23 | DRG: 766 | Disposition: A | Discharge status: Home / Self Care | Payer: Medicaid Other | Source: Ambulatory Visit | Attending: Family Medicine | Admitting: Family Medicine

## 2016-11-20 ENCOUNTER — Encounter (HOSPITAL_COMMUNITY): Payer: Self-pay | Admitting: Anesthesiology

## 2016-11-20 ENCOUNTER — Encounter (HOSPITAL_COMMUNITY)
Admission: RE | Disposition: A | Discharge status: Home / Self Care | Payer: Self-pay | Source: Ambulatory Visit | Attending: Family Medicine

## 2016-11-20 DIAGNOSIS — Z3A39 39 weeks gestation of pregnancy: Secondary | ICD-10-CM | POA: Diagnosis not present

## 2016-11-20 DIAGNOSIS — O34211 Maternal care for low transverse scar from previous cesarean delivery: Principal | ICD-10-CM | POA: Diagnosis present

## 2016-11-20 DIAGNOSIS — O34219 Maternal care for unspecified type scar from previous cesarean delivery: Secondary | ICD-10-CM | POA: Diagnosis present

## 2016-11-20 DIAGNOSIS — O99214 Obesity complicating childbirth: Secondary | ICD-10-CM | POA: Diagnosis present

## 2016-11-20 DIAGNOSIS — Z6839 Body mass index (BMI) 39.0-39.9, adult: Secondary | ICD-10-CM

## 2016-11-20 DIAGNOSIS — Z98891 History of uterine scar from previous surgery: Secondary | ICD-10-CM

## 2016-11-20 LAB — RPR: RPR: NONREACTIVE

## 2016-11-20 SURGERY — Surgical Case
Anesthesia: Spinal | Site: Abdomen

## 2016-11-20 MED ORDER — LACTATED RINGERS IV SOLN
125.0000 mL/h | INTRAVENOUS | Status: DC
  Administered 2016-11-20 (×2): via INTRAVENOUS
  Administered 2016-11-20: 125 mL/h via INTRAVENOUS

## 2016-11-20 MED ORDER — NALBUPHINE HCL 10 MG/ML IJ SOLN
INTRAMUSCULAR | Status: AC
  Filled 2016-11-20: qty 1

## 2016-11-20 MED ORDER — MEPERIDINE HCL 25 MG/ML IJ SOLN: 6.2500 mg | INTRAMUSCULAR | Status: DC | PRN

## 2016-11-20 MED ORDER — PROMETHAZINE HCL 25 MG/ML IJ SOLN
25.0000 mg | Freq: Four times a day (QID) | INTRAMUSCULAR | Status: DC | PRN
  Administered 2016-11-20: 25 mg via INTRAVENOUS
  Filled 2016-11-20: qty 1

## 2016-11-20 MED ORDER — OXYCODONE-ACETAMINOPHEN 5-325 MG PO TABS: 2.0000 | ORAL_TABLET | ORAL | Status: DC | PRN

## 2016-11-20 MED ORDER — KETOROLAC TROMETHAMINE 30 MG/ML IJ SOLN
INTRAMUSCULAR | Status: AC
  Filled 2016-11-20: qty 1

## 2016-11-20 MED ORDER — SILVER NITRATE-POT NITRATE 75-25 % EX MISC
1.0000 "application " | Freq: Once | CUTANEOUS | Status: DC
  Filled 2016-11-20: qty 1

## 2016-11-20 MED ORDER — CEFAZOLIN SODIUM-DEXTROSE 2-4 GM/100ML-% IV SOLN
2.0000 g | INTRAVENOUS | Status: AC
  Administered 2016-11-20: 2 g via INTRAVENOUS
  Filled 2016-11-20: qty 100

## 2016-11-20 MED ORDER — HYDROMORPHONE HCL 1 MG/ML IJ SOLN: 0.2500 mg | INTRAMUSCULAR | Status: DC | PRN

## 2016-11-20 MED ORDER — KETOROLAC TROMETHAMINE 30 MG/ML IJ SOLN: 30.0000 mg | Freq: Once | INTRAMUSCULAR | Status: DC

## 2016-11-20 MED ORDER — MORPHINE SULFATE (PF) 0.5 MG/ML IJ SOLN
INTRAMUSCULAR | Status: DC | PRN
  Administered 2016-11-20: .2 mg via INTRATHECAL

## 2016-11-20 MED ORDER — DIPHENHYDRAMINE HCL 50 MG/ML IJ SOLN
INTRAMUSCULAR | Status: AC
  Filled 2016-11-20: qty 1

## 2016-11-20 MED ORDER — PHENYLEPHRINE 8 MG IN D5W 100 ML (0.08MG/ML) PREMIX OPTIME
INJECTION | INTRAVENOUS | Status: AC
  Filled 2016-11-20: qty 100

## 2016-11-20 MED ORDER — LACTATED RINGERS IV SOLN
INTRAVENOUS | Status: DC
  Administered 2016-11-20 – 2016-11-21 (×2): via INTRAVENOUS

## 2016-11-20 MED ORDER — EPHEDRINE 5 MG/ML INJ
INTRAVENOUS | Status: AC
  Filled 2016-11-20: qty 10

## 2016-11-20 MED ORDER — KETOROLAC TROMETHAMINE 30 MG/ML IJ SOLN: 30.0000 mg | Freq: Four times a day (QID) | INTRAMUSCULAR | Status: DC | PRN

## 2016-11-20 MED ORDER — ONDANSETRON HCL 4 MG/2ML IJ SOLN
INTRAMUSCULAR | Status: AC
  Filled 2016-11-20: qty 2

## 2016-11-20 MED ORDER — SCOPOLAMINE 1 MG/3DAYS TD PT72: 1.0000 | MEDICATED_PATCH | Freq: Once | TRANSDERMAL | Status: DC

## 2016-11-20 MED ORDER — ACETAMINOPHEN 500 MG PO TABS
1000.0000 mg | ORAL_TABLET | Freq: Four times a day (QID) | ORAL | Status: AC
  Administered 2016-11-20 – 2016-11-21 (×3): 1000 mg via ORAL
  Filled 2016-11-20 (×3): qty 2

## 2016-11-20 MED ORDER — DIPHENHYDRAMINE HCL 25 MG PO CAPS: 25.0000 mg | ORAL_CAPSULE | ORAL | Status: DC | PRN

## 2016-11-20 MED ORDER — SILVER NITRATE-POT NITRATE 75-25 % EX MISC
CUTANEOUS | Status: AC
  Filled 2016-11-20: qty 1

## 2016-11-20 MED ORDER — PHENYLEPHRINE 8 MG IN D5W 100 ML (0.08MG/ML) PREMIX OPTIME
INJECTION | INTRAVENOUS | Status: DC | PRN
  Administered 2016-11-20: 60 ug/min via INTRAVENOUS
  Administered 2016-11-20: 80 ug/min via INTRAVENOUS

## 2016-11-20 MED ORDER — EPHEDRINE SULFATE 50 MG/ML IJ SOLN: INTRAMUSCULAR | Status: DC | PRN

## 2016-11-20 MED ORDER — PROMETHAZINE HCL 25 MG PO TABS: 25.0000 mg | ORAL_TABLET | Freq: Four times a day (QID) | ORAL | Status: DC | PRN

## 2016-11-20 MED ORDER — PHENYLEPHRINE 40 MCG/ML (10ML) SYRINGE FOR IV PUSH (FOR BLOOD PRESSURE SUPPORT)
PREFILLED_SYRINGE | INTRAVENOUS | Status: AC
  Filled 2016-11-20: qty 10

## 2016-11-20 MED ORDER — DIBUCAINE 1 % RE OINT
1.0000 | TOPICAL_OINTMENT | RECTAL | Status: DC | PRN
Start: 2016-11-20 — End: 2016-11-23

## 2016-11-20 MED ORDER — MORPHINE SULFATE (PF) 0.5 MG/ML IJ SOLN
INTRAMUSCULAR | Status: AC
  Filled 2016-11-20: qty 10

## 2016-11-20 MED ORDER — BUPIVACAINE IN DEXTROSE 0.75-8.25 % IT SOLN
INTRATHECAL | Status: DC | PRN
  Administered 2016-11-20: 1.4 mL via INTRATHECAL

## 2016-11-20 MED ORDER — OXYCODONE-ACETAMINOPHEN 5-325 MG PO TABS
1.0000 | ORAL_TABLET | ORAL | Status: DC | PRN
  Administered 2016-11-21 – 2016-11-22 (×5): 1 via ORAL
  Filled 2016-11-20 (×6): qty 1

## 2016-11-20 MED ORDER — SIMETHICONE 80 MG PO CHEW
80.0000 mg | CHEWABLE_TABLET | ORAL | Status: DC
  Administered 2016-11-21 – 2016-11-22 (×2): 80 mg via ORAL
  Filled 2016-11-20 (×3): qty 1

## 2016-11-20 MED ORDER — NALOXONE HCL 2 MG/2ML IJ SOSY: 1.0000 ug/kg/h | PREFILLED_SYRINGE | INTRAVENOUS | Status: DC | PRN

## 2016-11-20 MED ORDER — NALOXONE HCL 0.4 MG/ML IJ SOLN: 0.4000 mg | INTRAMUSCULAR | Status: DC | PRN

## 2016-11-20 MED ORDER — ONDANSETRON HCL 4 MG/2ML IJ SOLN
INTRAMUSCULAR | Status: DC | PRN
  Administered 2016-11-20: 4 mg via INTRAVENOUS

## 2016-11-20 MED ORDER — SIMETHICONE 80 MG PO CHEW
80.0000 mg | CHEWABLE_TABLET | Freq: Three times a day (TID) | ORAL | Status: DC
  Administered 2016-11-21 – 2016-11-22 (×3): 80 mg via ORAL
  Filled 2016-11-20 (×4): qty 1

## 2016-11-20 MED ORDER — NALBUPHINE HCL 10 MG/ML IJ SOLN: 5.0000 mg | Freq: Once | INTRAMUSCULAR | Status: DC | PRN

## 2016-11-20 MED ORDER — DIPHENHYDRAMINE HCL 25 MG PO CAPS: 25.0000 mg | ORAL_CAPSULE | Freq: Four times a day (QID) | ORAL | Status: DC | PRN

## 2016-11-20 MED ORDER — SODIUM CHLORIDE 0.9 % IR SOLN
Status: DC | PRN
  Administered 2016-11-20: 1

## 2016-11-20 MED ORDER — PRENATAL MULTIVITAMIN CH: 1.0000 | ORAL_TABLET | Freq: Every day | ORAL | Status: DC

## 2016-11-20 MED ORDER — OXYTOCIN 10 UNIT/ML IJ SOLN
INTRAMUSCULAR | Status: AC
  Filled 2016-11-20: qty 4

## 2016-11-20 MED ORDER — WITCH HAZEL-GLYCERIN EX PADS: 1.0000 "application " | MEDICATED_PAD | CUTANEOUS | Status: DC | PRN

## 2016-11-20 MED ORDER — PROMETHAZINE HCL 25 MG/ML IJ SOLN: 6.2500 mg | INTRAMUSCULAR | Status: DC | PRN

## 2016-11-20 MED ORDER — TETANUS-DIPHTH-ACELL PERTUSSIS 5-2.5-18.5 LF-MCG/0.5 IM SUSP: 0.5000 mL | Freq: Once | INTRAMUSCULAR | Status: DC

## 2016-11-20 MED ORDER — MENTHOL 3 MG MT LOZG: 1.0000 | LOZENGE | OROMUCOSAL | Status: DC | PRN

## 2016-11-20 MED ORDER — OXYTOCIN 40 UNITS IN LACTATED RINGERS INFUSION - SIMPLE MED
INTRAVENOUS | Status: DC | PRN
  Administered 2016-11-20: 40 [IU] via INTRAVENOUS

## 2016-11-20 MED ORDER — LACTATED RINGERS IV SOLN
INTRAVENOUS | Status: DC | PRN
  Administered 2016-11-20: 13:00:00 via INTRAVENOUS

## 2016-11-20 MED ORDER — SODIUM CHLORIDE 0.9% FLUSH: 3.0000 mL | INTRAVENOUS | Status: DC | PRN

## 2016-11-20 MED ORDER — NALBUPHINE HCL 10 MG/ML IJ SOLN: 5.0000 mg | INTRAMUSCULAR | Status: DC | PRN

## 2016-11-20 MED ORDER — FENTANYL CITRATE (PF) 100 MCG/2ML IJ SOLN
INTRAMUSCULAR | Status: DC | PRN
  Administered 2016-11-20: 20 ug via INTRATHECAL

## 2016-11-20 MED ORDER — ZOLPIDEM TARTRATE 5 MG PO TABS: 5.0000 mg | ORAL_TABLET | Freq: Every evening | ORAL | Status: DC | PRN

## 2016-11-20 MED ORDER — ACETAMINOPHEN 325 MG PO TABS: 650.0000 mg | ORAL_TABLET | ORAL | Status: DC | PRN

## 2016-11-20 MED ORDER — IBUPROFEN 600 MG PO TABS
600.0000 mg | ORAL_TABLET | Freq: Four times a day (QID) | ORAL | Status: DC
  Administered 2016-11-20 – 2016-11-23 (×11): 600 mg via ORAL
  Filled 2016-11-20 (×11): qty 1

## 2016-11-20 MED ORDER — FENTANYL CITRATE (PF) 100 MCG/2ML IJ SOLN
INTRAMUSCULAR | Status: AC
  Filled 2016-11-20: qty 2

## 2016-11-20 MED ORDER — PRENATAL MULTIVITAMIN CH
1.0000 | ORAL_TABLET | Freq: Every day | ORAL | Status: DC
  Administered 2016-11-21 – 2016-11-22 (×2): 1 via ORAL
  Filled 2016-11-20 (×2): qty 1

## 2016-11-20 MED ORDER — SIMETHICONE 80 MG PO CHEW
80.0000 mg | CHEWABLE_TABLET | ORAL | Status: DC | PRN
  Administered 2016-11-20: 80 mg via ORAL

## 2016-11-20 MED ORDER — PROMETHAZINE HCL 25 MG RE SUPP: 25.0000 mg | Freq: Four times a day (QID) | RECTAL | Status: DC | PRN

## 2016-11-20 MED ORDER — KETOROLAC TROMETHAMINE 30 MG/ML IJ SOLN
30.0000 mg | Freq: Four times a day (QID) | INTRAMUSCULAR | Status: DC | PRN
  Administered 2016-11-20: 30 mg via INTRAMUSCULAR

## 2016-11-20 MED ORDER — DIPHENHYDRAMINE HCL 50 MG/ML IJ SOLN: 12.5000 mg | INTRAMUSCULAR | Status: DC | PRN

## 2016-11-20 MED ORDER — NALBUPHINE HCL 10 MG/ML IJ SOLN
5.0000 mg | INTRAMUSCULAR | Status: DC | PRN
  Administered 2016-11-20: 5 mg via INTRAVENOUS

## 2016-11-20 MED ORDER — COCONUT OIL OIL: 1.0000 "application " | TOPICAL_OIL | Status: DC | PRN

## 2016-11-20 MED ORDER — IBUPROFEN 600 MG PO TABS: 600.0000 mg | ORAL_TABLET | Freq: Four times a day (QID) | ORAL | Status: DC | PRN

## 2016-11-20 MED ORDER — SENNOSIDES-DOCUSATE SODIUM 8.6-50 MG PO TABS
2.0000 | ORAL_TABLET | ORAL | Status: DC
Start: 2016-11-21 — End: 2016-11-23
  Administered 2016-11-20 – 2016-11-22 (×3): 2 via ORAL
  Filled 2016-11-20 (×3): qty 2

## 2016-11-20 MED ORDER — ONDANSETRON HCL 4 MG/2ML IJ SOLN
4.0000 mg | Freq: Three times a day (TID) | INTRAMUSCULAR | Status: DC | PRN
  Administered 2016-11-20: 4 mg via INTRAVENOUS

## 2016-11-20 MED ORDER — PHENYLEPHRINE HCL 10 MG/ML IJ SOLN
INTRAMUSCULAR | Status: DC | PRN
  Administered 2016-11-20: 80 ug via INTRAVENOUS

## 2016-11-20 MED ORDER — OXYTOCIN 40 UNITS IN LACTATED RINGERS INFUSION - SIMPLE MED: 2.5000 [IU]/h | INTRAVENOUS | Status: AC

## 2016-11-20 SURGICAL SUPPLY — 41 items
BENZOIN TINCTURE PRP APPL 2/3 (GAUZE/BANDAGES/DRESSINGS) ×6 IMPLANT
CHLORAPREP W/TINT 26ML (MISCELLANEOUS) ×3 IMPLANT
CLAMP CORD UMBIL (MISCELLANEOUS) IMPLANT
CLOSURE STERI STRIP 1/2 X4 (GAUZE/BANDAGES/DRESSINGS) ×3 IMPLANT
CLOSURE WOUND 1/2 X4 (GAUZE/BANDAGES/DRESSINGS) ×2
CLOTH BEACON ORANGE TIMEOUT ST (SAFETY) ×3 IMPLANT
DRSG OPSITE POSTOP 4X10 (GAUZE/BANDAGES/DRESSINGS) ×6 IMPLANT
ELECT REM PT RETURN 9FT ADLT (ELECTROSURGICAL) ×3
ELECTRODE REM PT RTRN 9FT ADLT (ELECTROSURGICAL) ×1 IMPLANT
EXTRACTOR VACUUM M CUP 4 TUBE (SUCTIONS) IMPLANT
EXTRACTOR VACUUM M CUP 4' TUBE (SUCTIONS)
GAUZE SPONGE 4X4 12PLY STRL (GAUZE/BANDAGES/DRESSINGS) ×6 IMPLANT
GLOVE BIO SURGEON STRL SZ 6.5 (GLOVE) ×2 IMPLANT
GLOVE BIO SURGEONS STRL SZ 6.5 (GLOVE) ×1
GLOVE BIOGEL PI IND STRL 7.0 (GLOVE) ×3 IMPLANT
GLOVE BIOGEL PI IND STRL 7.5 (GLOVE) ×2 IMPLANT
GLOVE BIOGEL PI INDICATOR 7.0 (GLOVE) ×6
GLOVE BIOGEL PI INDICATOR 7.5 (GLOVE) ×4
GLOVE ECLIPSE 7.5 STRL STRAW (GLOVE) ×3 IMPLANT
GOWN STRL REUS W/TWL LRG LVL3 (GOWN DISPOSABLE) ×9 IMPLANT
KIT ABG SYR 3ML LUER SLIP (SYRINGE) IMPLANT
NEEDLE HYPO 25X5/8 SAFETYGLIDE (NEEDLE) IMPLANT
NS IRRIG 1000ML POUR BTL (IV SOLUTION) ×3 IMPLANT
PACK C SECTION WH (CUSTOM PROCEDURE TRAY) ×3 IMPLANT
PAD ABD 7.5X8 STRL (GAUZE/BANDAGES/DRESSINGS) ×6 IMPLANT
PAD OB MATERNITY 4.3X12.25 (PERSONAL CARE ITEMS) ×3 IMPLANT
PENCIL SMOKE EVAC W/HOLSTER (ELECTROSURGICAL) ×3 IMPLANT
RTRCTR C-SECT PINK 25CM LRG (MISCELLANEOUS) ×3 IMPLANT
SCISSORS CVD SS 5MM (INSTRUMENTS) ×3 IMPLANT
SPONGE GAUZE 4X4 12PLY STER LF (GAUZE/BANDAGES/DRESSINGS) ×6 IMPLANT
STRIP CLOSURE SKIN 1/2X4 (GAUZE/BANDAGES/DRESSINGS) ×4 IMPLANT
SUT MNCRL 0 VIOLET CTX 36 (SUTURE) ×1 IMPLANT
SUT MONOCRYL 0 CTX 36 (SUTURE) ×2
SUT PDS AB 0 CTX 60 (SUTURE) ×3 IMPLANT
SUT VIC AB 0 CTX 36 (SUTURE) ×6
SUT VIC AB 0 CTX36XBRD ANBCTRL (SUTURE) ×3 IMPLANT
SUT VIC AB 2-0 CT1 27 (SUTURE) ×4
SUT VIC AB 2-0 CT1 TAPERPNT 27 (SUTURE) ×2 IMPLANT
SUT VIC AB 4-0 KS 27 (SUTURE) ×3 IMPLANT
TOWEL OR 17X24 6PK STRL BLUE (TOWEL DISPOSABLE) ×3 IMPLANT
TRAY FOLEY CATH SILVER 14FR (SET/KITS/TRAYS/PACK) ×3 IMPLANT

## 2016-11-20 NOTE — Op Note (Signed)
Michelle Patel PROCEDURE DATE: 11/20/2016  PREOPERATIVE DIAGNOSIS: Intrauterine pregnancy at  3680w2d weeks gestation; Previous cesarean x3  POSTOPERATIVE DIAGNOSIS: The same  PROCEDURE: Repeat Low Transverse Cesarean Section  SURGEON:  Dr. Candelaria CelesteJacob Reade Trefz  ASSISTANT: Dr Shonna ChockNoah Wouk, Dr Adine MaduraJim Arnold  INDICATIONS: Michelle Patel is a 36 y.o. [id number] at 5980w2d scheduled for cesarean section secondary to previous cesarean x3.  The risks of cesarean section discussed with the patient included but were not limited to: bleeding which may require transfusion or reoperation; infection which may require antibiotics; injury to bowel, bladder, ureters or other surrounding organs; injury to the fetus; need for additional procedures including hysterectomy in the event of a life-threatening hemorrhage; placental abnormalities wth subsequent pregnancies, incisional problems, thromboembolic phenomenon and other postoperative/anesthesia complications. The patient concurred with the proposed plan, giving informed written consent for the procedure.    FINDINGS:  Viable female infant in vertex presentation.  Apgars 8 and 9, weight, 6 pounds and 12 ounces.  Clear amniotic fluid.  Intact placenta, three vessel cord.  Normal uterus, fallopian tubes and ovaries bilaterally.  ANESTHESIA:    Spinal INTRAVENOUS FLUIDS:  3500 ml ESTIMATED BLOOD LOSS: 800 ml URINE OUTPUT:  125 ml SPECIMENS: Placenta sent to pathology COMPLICATIONS: None immediate  PROCEDURE IN DETAIL:  The patient received intravenous antibiotics and had sequential compression devices applied to her lower extremities while in the preoperative area.  She was then taken to the operating room where spinal anesthesia was administered and was found to be adequate. She was then placed in a dorsal supine position with a leftward tilt, and prepped and draped in a sterile manner.  A foley catheter was placed into her bladder and attached to constant gravity, which drained  clear fluid throughout.  After an adequate timeout was performed, a Pfannenstiel skin incision was made with scalpel and carried through to the underlying layer of fascia. The fascia was incised in the midline and this incision was extended bilaterally using the Mayo scissors. Kocher clamps were applied to the superior aspect of the fascial incision and the underlying rectus muscles were dissected off bluntly. A similar process was carried out on the inferior aspect of the facial incision. The rectus muscles were separated in the midline bluntly and the peritoneum was entered bluntly. The rectus muscles were transected by 3cm on the left to allow for improved visualization. An Alexis retractor was placed to aid in visualization of the uterus.  Attention was turned to the lower uterine segment. A bladder flap was made by elevating the vesicoperitoneum and incising it with metzenbaum scissors. The incision was extended bilaterally and the flap was carefully separated from the underlying lower segment. A transverse hysterotomy was made with a scalpel and extended bilaterally bluntly. The infant was successfully delivered, and cord was clamped and cut and infant was handed over to awaiting neonatology team. Uterine massage was then administered and the placenta delivered intact with three-vessel cord. The uterus was then cleared of clot and debris.  The hysterotomy was closed with 0 Vicryl in a running locked fashion, and an imbricating layer was also placed with a 0 Vicryl. Overall, excellent hemostasis was noted. The abdomen and the pelvis were cleared of all clot and debris and the Jon Gillslexis was removed. Hemostasis was confirmed on all surfaces.  The peritoneum was reapproximated using 2-0 vicryl running stitches. The fascia was then closed using 0 Looped PDS in a running fashion. The subcutaneous layer was reapproximated with plain gut and the skin was closed  with 4-0 vicryl. The patient tolerated the procedure well.  Sponge, lap, instrument and needle counts were correct x 2. She was taken to the recovery room in stable condition.    Levie Heritage, DO 11/20/2016 1:46 PM

## 2016-11-20 NOTE — Progress Notes (Signed)
Informed MOB that solid foods was not advised at this time due to her persistent nausea/vomitting. [facility]. MOB was given phenergan and at 2030 and advised not to eat any solid foods. At 2100 I went in to MOB's room and she was eating a cheese steak. I informed MOB that she may still feel nauseas and vomit, due to so little time between the Phenergan and her food.

## 2016-11-20 NOTE — Transfer of Care (Signed)
Immediate Anesthesia Transfer of Care Note  Patient: Michelle Patel  Procedure(s) Performed: Procedure(s): CESAREAN SECTION (N/A)  Patient Location: PACU  Anesthesia Type:Spinal  Level of Consciousness: awake, alert  and oriented  Airway & Oxygen Therapy: Patient Spontanous Breathing  Post-op Assessment: Report given to RN and Post -op Vital signs reviewed and stable  Post vital signs: Reviewed and stable  Last Vitals:  Vitals:   11/20/16 1024  BP: (!) 120/92  Pulse: 60  Resp: 18  Temp: 37 C    Last Pain:  Vitals:   11/20/16 1024  TempSrc: Oral         Complications: No apparent anesthesia complications

## 2016-11-20 NOTE — Anesthesia Procedure Notes (Signed)
Spinal  Patient location during procedure: OR Start time: 11/20/2016 11:45 AM End time: 11/20/2016 11:49 AM Staffing Anesthesiologist: Leilani AbleHATCHETT, Princess Karnes Performed: anesthesiologist  Preanesthetic Checklist Completed: patient identified, surgical consent, pre-op evaluation, timeout performed, IV checked, risks and benefits discussed and monitors and equipment checked Spinal Block Patient position: sitting Prep: site prepped and draped and DuraPrep Patient monitoring: heart rate, cardiac monitor, continuous pulse ox and blood pressure Approach: midline Location: L3-4 Injection technique: single-shot Needle Needle type: Sprotte  Needle gauge: 24 G Needle length: 9 cm Needle insertion depth: 7 cm Assessment Sensory level: T4

## 2016-11-20 NOTE — Anesthesia Postprocedure Evaluation (Signed)
Anesthesia Post Note  Patient: Michelle Patel  Procedure(s) Performed: Procedure(s) (LRB): CESAREAN SECTION (N/A)  Patient location during evaluation: PACU Anesthesia Type: Spinal Level of consciousness: awake Pain management: pain level controlled Vital Signs Assessment: post-procedure vital signs reviewed and stable Respiratory status: spontaneous breathing Cardiovascular status: stable Postop Assessment: no headache, no backache, spinal receding, patient able to bend at knees and no signs of nausea or vomiting Anesthetic complications: no        Last Vitals:  Vitals:   11/20/16 1430 11/20/16 1445  BP: 111/74 113/76  Pulse: (!) 50 (!) 51  Resp: 16 16  Temp:      Last Pain:  Vitals:   11/20/16 1445  TempSrc:   PainSc: 4    Pain Goal:                 Farrie Sann JR,JOHN Egor Fullilove

## 2016-11-20 NOTE — H&P (Signed)
Faculty Practice H&P  Johnisha Louks is a 36 y.o. female [phone] with IUP at [redacted]w[redacted]d presenting for repeat cesarean section for prior cesarean x3. Pregnancy was been complicated by late prenatal care, language barrier, prior cesarean x3.    Pt states she has been having no contractions, no vaginal bleeding, intact membranes, with normal fetal movement.     Prenatal Course Source of Care: [facility]  with onset of care at 35 weeks Pregnancy complications or risks: Patient Active Problem List   Diagnosis Date Noted  . Supervision of normal pregnancy in third trimester 10/24/2016  . Previous cesarean section complicating pregnancy, antepartum condition or complication 10/24/2016  . AMA (advanced maternal age) multigravida 35+ 10/24/2016  . Language barrier 10/24/2016   Patient uncertain about birth control.  She plans to plans to breastfeed  Prenatal labs and studies: ABO, Rh: --/--/O POS (02/05 1205) Antibody: NEG (02/05 1200) Rubella: !Error! RPR: Non Reactive (02/05 1200)  HBsAg: NEGATIVE (01/10 1420)  HIV: NONREACTIVE (01/10 1420)  GBS:    1 hr Glucola not done Genetic screening declined Anatomy US normal  Past Medical History:  Past Medical History:  Diagnosis Date  . Complication of anesthesia    intolerance of large amts of anesthesia  . Medical history non-contributory     Past Surgical History:  Past Surgical History:  Procedure Laterality Date  . CESAREAN SECTION     2012, 2013, 2015    Obstetrical History:  OB History    Gravida Para Term Preterm AB Living   4 3 3     3    SAB TAB Ectopic Multiple Live Births           3      Gynecological History:  OB History    Gravida Para Term Preterm AB Living   4 3 3     3    SAB TAB Ectopic Multiple Live Births           3      Social History:  Social History   Social History  . Marital status: Married    Spouse name: N/A  . Number of children: N/A  . Years of education: N/A   Social History Main Topics  .  Smoking status: Never Smoker  . Smokeless tobacco: Never Used  . Alcohol use No  . Drug use: No  . Sexual activity: Not Currently    Birth control/ protection: Pill     Comment: got pregnant on the pill   Other Topics Concern  . None   Social History Narrative  . None    Family History: History reviewed. No pertinent family history.  Medications:  Prenatal vitamins,  Current Facility-Administered Medications  Medication Dose Route Frequency Provider Last Rate Last Dose  . ceFAZolin (ANCEF) IVPB 2g/100 mL premix  2 g Intravenous On Call to OR Levie Heritage, DO      . lactated ringers infusion  125 mL/hr Intravenous Continuous Levie Heritage, DO 125 mL/hr at 11/20/16 1029 125 mL/hr at 11/20/16 1029    Allergies:  Allergies  Allergen Reactions  . Food Other (See Comments)    PATIENT DOES NOT EAT PORK    Review of Systems: - negative  Physical Exam: Blood pressure (!) 120/92, pulse 60, temperature 98.6 F (37 C), temperature source Oral, last menstrual period 02/19/2016, SpO2 100 %. GENERAL: Well-developed, well-nourished female in no acute distress.  LUNGS: Clear to auscultation bilaterally.  HEART: Regular rate and rhythm. ABDOMEN: Soft, nontender, nondistended, gravid.  EFW 8 lbs EXTREMITIES: Nontender, no edema, 2+ distal pulses. FHT:  Baseline rate 125 bpm      Pertinent Labs/Studies:   CBC    Component Value Date/Time   WBC 6.2 11/19/2016 1200   RBC 4.28 11/19/2016 1200   HGB 12.1 11/19/2016 1200   HCT 36.5 11/19/2016 1200   PLT 252 11/19/2016 1200   MCV 85.3 11/19/2016 1200   MCH 28.3 11/19/2016 1200   MCHC 33.2 11/19/2016 1200   RDW 14.8 11/19/2016 1200   LYMPHSABS 2,048 10/24/2016 1420   MONOABS 768 10/24/2016 1420   EOSABS 64 10/24/2016 1420   BASOSABS 0 10/24/2016 1420     Assessment : Frederic JerichoSamah Batdorf is a 36 y.o. G4P3003 at 3456w2d being admitted for cesarean section secondary to prior cesarean section  Plan: The risks of cesarean section  discussed with the patient included but were not limited to: bleeding which may require transfusion or reoperation; infection which may require antibiotics; injury to bowel, bladder, ureters or other surrounding organs; injury to the fetus; need for additional procedures including hysterectomy in the event of a life-threatening hemorrhage; placental abnormalities wth subsequent pregnancies, incisional problems, thromboembolic phenomenon and other postoperative/anesthesia complications. The patient concurred with the proposed plan, giving informed written consent for the procedure.   Patient has been NPO since last night and will remain NPO for procedure.  Preoperative prophylactic Ancef ordered on call to the OR.    Levie HeritageJacob J Vernell Back, DO 11/20/2016, 11:20 AM

## 2016-11-20 NOTE — Anesthesia Preprocedure Evaluation (Signed)
Anesthesia Evaluation  Patient identified by MRN, date of birth, ID band Patient awake    Reviewed: Allergy & Precautions, H&P , NPO status , Patient's Chart, lab work & pertinent test results  Airway Mallampati: II  TM Distance: >3 FB Neck ROM: full    Dental no notable dental hx.    Pulmonary neg pulmonary ROS,    Pulmonary exam normal        Cardiovascular negative cardio ROS Normal cardiovascular exam     Neuro/Psych negative neurological ROS  negative psych ROS   GI/Hepatic negative GI ROS, Neg liver ROS,   Endo/Other  Morbid obesity  Renal/GU negative Renal ROS     Musculoskeletal   Abdominal (+) + obese,   Peds  Hematology negative hematology ROS (+)   Anesthesia Other Findings   Reproductive/Obstetrics (+) Pregnancy                             Anesthesia Physical Anesthesia Plan  ASA: III  Anesthesia Plan: Spinal   Post-op Pain Management:    Induction:   Airway Management Planned:   Additional Equipment:   Intra-op Plan:   Post-operative Plan:   Informed Consent: I have reviewed the patients History and Physical, chart, labs and discussed the procedure including the risks, benefits and alternatives for the proposed anesthesia with the patient or authorized representative who has indicated his/her understanding and acceptance.     Plan Discussed with: Surgeon and CRNA  Anesthesia Plan Comments:         Anesthesia Quick Evaluation

## 2016-11-21 LAB — CBC
HEMATOCRIT: 29.4 % — AB (ref 36.0–46.0)
Hemoglobin: 9.8 g/dL — ABNORMAL LOW (ref 12.0–15.0)
MCH: 28.6 pg (ref 26.0–34.0)
MCHC: 33.3 g/dL (ref 30.0–36.0)
MCV: 85.7 fL (ref 78.0–100.0)
Platelets: 219 10*3/uL (ref 150–400)
RBC: 3.43 MIL/uL — AB (ref 3.87–5.11)
RDW: 15 % (ref 11.5–15.5)
WBC: 6.9 10*3/uL (ref 4.0–10.5)

## 2016-11-21 NOTE — Progress Notes (Signed)
Assessment completed using the telephone interpreter [phone] for the Arabic language. Mom educated about the use of SCD's, safe sleep, crib safety, feedings and log, and the plan of care for tonight. Mom verbalized understanding. Mom has not recorded any voids or stools and said she wasn't aware she needed to. RN encouraged her to start recording that information along with feedings. Mom rated pain a 5/10 and RN said medication would be given at 2100 when it is due again. Mom verbalized understanding. Mom educated to leave extra blankets, diapers, and wipes out of the crib, and mom said okay. Mom concerned with the amount of blood on her honeycomb this AM, and RN educated her and informed her that the dressing is CDI with no further drainage. Mom was happy that has resolved. Mom had no further questions. RN asked mom to call if she needed anything. Will continue to monitor. Royston CowperIsley, Alexah Kivett E, RN

## 2016-11-21 NOTE — Progress Notes (Signed)
Went into [employer] room [facility] in an effort to do some teaching. Dexter was not in working order to contact the Clinical research associateArabic Interpreter.

## 2016-11-21 NOTE — Anesthesia Postprocedure Evaluation (Addendum)
Anesthesia Post Note  Patient: Michelle Patel  Procedure(s) Performed: Procedure(s) (LRB): CESAREAN SECTION (N/A)  Patient location during evaluation: Mother Baby Anesthesia Type: Spinal Level of consciousness: awake Pain management: pain level controlled Vital Signs Assessment: post-procedure vital signs reviewed and stable Respiratory status: spontaneous breathing Cardiovascular status: stable Postop Assessment: no headache, no backache, spinal receding and patient able to bend at knees Anesthetic complications: no        Last Vitals:  Vitals:   11/20/16 2323 11/21/16 0300  BP: 98/62 (!) 98/54  Pulse: 73 79  Resp: 18 18  Temp: 36.8 C 36.8 C    Last Pain:  Vitals:   11/21/16 0705  TempSrc:   PainSc: 0-No pain   Pain Goal:                 Edison PaceWILKERSON,VALERIE

## 2016-11-21 NOTE — Lactation Note (Signed)
This note was copied from a baby's chart. Lactation Consultation Note Took dexter interpreter sever into rm. For consult. Server would not connect. RN in rm. As well. Significant other left, will not return until am. Mom doesn't speak any English.  Mom BF in cradle position. RN stated baby is spitting up after BF d/t will not burp. RN burped baby after feeding once, baby didn't not have emesis. Baby has been BF well to breast.  Mom has large breast, easy flow colostrum.  Consult will have to be another time after interpreter available.  Patient Name: Michelle Patel [id number]'WToday's Date: 11/21/2016     Maternal Data    Feeding    LATCH Score/Interventions                      Lactation Tools Discussed/Used     Consult Status      Michelle Patel, Diamond NickelLAURA G 11/21/2016, 4:21 AM

## 2016-11-21 NOTE — Clinical Social Work Maternal (Signed)
  CLINICAL SOCIAL WORK MATERNAL/CHILD NOTE  Patient Details  Name: Michelle Patel MRN: [id number] Date of Birth: Aug 27, 1981  Date:  11/21/2016  Clinical Social Worker Initiating Note:  Laurey Arrow Date/ Time Initiated:  11/21/16/1301     Child's Name:  Undecided at the time of assessment   Legal Guardian:  Mother   Need for Interpreter:  None   Date of Referral:  11/21/16     Reason for Referral:  Late or No Prenatal Care  (MOB recently located to [facility] from [place].)   Referral Source:  [facility]   Address:  [address] [place], [place] [address]  Phone number:  [phone]   Household Members:  Self, Minor Children, Significant Other   Natural Supports (not living in the home):  Friends   Professional Supports: None   Employment: Unemployed   Type of Work:     Education:  Engineer, maintenance Resources:  Other(Comment) (MOB was given information to apply for [facility] and Food Stamps)   Other Resources:      Cultural/Religious Considerations Which May Impact Care:  Per MOB Face Sheet, MOB is Muslim  Strengths:  Ability to meet basic needs , Home prepared for child    Risk Factors/Current Problems:  Other (Comment) (new to area may need additonal community resources. )   Cognitive State:  Able to Concentrate , Alert , Linear Thinking , Insightful    Mood/Affect:  Bright , Happy , Comfortable , Interested    CSW Assessment: CSW met with MOB to complete an assessment for Late [facility].  MOB was engaged during assessment and gave CSW permission to meet with MOB while FOB  was present. CSW utilized [employer] to assist with language barrier.  MOB was polite, inviting, and appeared interested in meeting with CSW.  CSW inquired about MOB's Late PNC, and MOB and FOB reported that the family received PNC starting at 6weeks when the family was in [place].  MOB stated that [facility] visits were routine and MOB never missed an appointment.  MOB disclosed that  MOB immediately engaged in [facility] when family relocated to [place]. CSW informed MOB of the two screenings for the infant. CSW informed MOB for resources to enroll in [facility] and [employer]. MOB and FOB communicated they feel prepared to parent infant.  The family denied any other psychosocial concerns.   CSW Plan/Description:  Patient/Family Education , No Further Intervention Required/No Barriers to Discharge, Information/Referral to [employer]  (CSW will following infats UDS and CDS and will make a report if warranted. )   Laurey Arrow, MSW, LCSW Clinical Social Work [phone]    Dimple Nanas, [place] 11/21/2016, 1:13 PM

## 2016-11-21 NOTE — Progress Notes (Signed)
Used phone interpreter to interpret in Arabic for MOB. Informed MOB with interpreter [phone] that she needs to get up and go to the bathroom. I informed MOB that she needs to burp baby after every feed on the breast, because the baby is throwing up after every feed.

## 2016-11-21 NOTE — Addendum Note (Signed)
Addendum  created 11/21/16 1012 by Earmon PhoenixValerie P Eilish Mcdaniel, CRNA   Sign clinical note

## 2016-11-21 NOTE — Progress Notes (Signed)
POSTPARTUM PROGRESS NOTE  Post Op day #1   Subjective:  Michelle Patel is a 36 y.o. [id number] 2253w2d s/p RLTCS.  No acute events overnight.  Pt denies problems with ambulating, voiding or po intake.  She denies nausea or vomiting.  Pain is well controlled.  She has not had flatus. She has not had bowel movement.  Lochia Minimal.   Objective: Blood pressure (!) 98/54, pulse 79, temperature 98.3 F (36.8 C), temperature source Oral, resp. rate 18, height 5\' 4"  (1.626 m), weight 105.2 kg (232 lb), last menstrual period 02/19/2016, SpO2 94 %, unknown if currently breastfeeding.  Physical Exam:  General: alert, cooperative and no distress Lochia:normal flow Chest: CTAB Heart: RRR no m/r/g Abdomen: +BS, soft, nontender,  Uterine Fundus: firm DVT Evaluation: No calf swelling or tenderness Extremities: no edema   Recent Labs  11/19/16 1200 11/21/16 0505  HGB 12.1 9.8*  HCT 36.5 29.4*    Assessment/Plan:  ASSESSMENT: Michelle Patel is a 36 y.o. A5W0981G4P4004 5053w2d s/p RLTCS. Not passing gas or BM yet.  Pain well controlled.  Honeycomb saturated. Removed pressure dressing and replaced honeycomb. No active bleeding. Will continue to monitor. Encouraging ambulation today.   Plan for discharge tomorrow   LOS: 1 day   Renne Muscaaniel L Warden, MD PGY-1 Center for [facility], [facility]  11/21/2016, 9:48 AM   OB FELLOW POSTPARTUM PROGRESS NOTE ATTESTATION  I have seen and examined this patient and agree with above documentation in the resident's note.   Ernestina PennaNicholas Kalanie Fewell, MD 9:54 AM

## 2016-11-21 NOTE — Lactation Note (Signed)
This note was copied from a baby's chart. Lactation Consultation Note  Patient Name: Michelle Patel ZOXWR'UToday's Date: 11/21/2016 Reason for consult: Initial assessment [employer] Arabic interpreter used for visit [phone]. Mom reports baby is nursing well. She is breast/bottle feeding, reporting "no milk". Basic teaching reviewed with Mom and advised to offer breast each feeding 8-12 times or more in 24 hours. Both breasts when possible for 15-30 minutes then supplement if baby not satisfied at breast. Lactation brochure left for review, advised of OP services and support group. Mom denies other questions/concerns. Encouraged to call for assist as needed.   Maternal Data Has patient been taught Hand Expression?: Yes Does the patient have breastfeeding experience prior to this delivery?: Yes  Feeding Feeding Type: Bottle Fed - Formula Nipple Type: Slow - flow Length of feed: 0 min  LATCH Score/Interventions                      Lactation Tools Discussed/Used WIC Program: No   Consult Status Consult Status: Follow-up Date: 11/22/16 Follow-up type: In-patient    Alfred LevinsGranger, Anntoinette Haefele Ann 11/21/2016, 3:26 PM

## 2016-11-22 MED ORDER — DOCUSATE SODIUM 100 MG PO CAPS: 100.0000 mg | ORAL_CAPSULE | Freq: Two times a day (BID) | ORAL | 0 refills | Status: AC

## 2016-11-22 MED ORDER — IBUPROFEN 600 MG PO TABS: 600.0000 mg | ORAL_TABLET | Freq: Four times a day (QID) | ORAL | 0 refills | Status: AC | PRN

## 2016-11-22 MED ORDER — OXYCODONE-ACETAMINOPHEN 5-325 MG PO TABS: 1.0000 | ORAL_TABLET | ORAL | 0 refills | Status: AC | PRN

## 2016-11-22 NOTE — Progress Notes (Signed)
POSTPARTUM PROGRESS NOTE  Post Partum Day #2  Subjective:  Michelle Patel is a 36 y.o. [id number] 1777w2d s/p RLTCS.  No acute events overnight.  Pt denies problems with ambulating, voiding or po intake.  She denies nausea or vomiting.  Pain is well controlled.  She has had flatus. She has not had bowel movement.  Lochia Minimal.   Objective: Blood pressure 119/75, pulse 65, temperature 97.8 F (36.6 C), temperature source Oral, resp. rate 18, height 5\' 4"  (1.626 m), weight 105.2 kg (232 lb), last menstrual period 02/19/2016, SpO2 92 %, unknown if currently breastfeeding.  Physical Exam:  General: alert, cooperative and no distress Lochia:normal flow Chest: CTAB Heart: RRR no m/r/g Abdomen: +BS, soft, nontender,  Uterine Fundus: soft DVT Evaluation: No calf swelling or tenderness Extremities:  No edema   Recent Labs  11/19/16 1200 11/21/16 0505  HGB 12.1 9.8*  HCT 36.5 29.4*    Assessment/Plan:  ASSESSMENT: Michelle Patel is a 36 y.o. [id number] 2577w2d s/p RLTCS.  Patient reports pain well controlled, eating and drinking OK, but is requiring assistance to ambulate and still feeling a bit dizzy.  Will keep her today and continue to observe.   Plan for discharge tomorrow   LOS: 2 days   Renne Muscaaniel L Vail Vuncannon, MD PGY-1 Center for [facility], [facility]  11/22/2016, 8:47 AM

## 2016-11-22 NOTE — Progress Notes (Signed)
Telephone interpreter [phone] used for assessment. Mom standing in room when RN entered bending over with pain. RN and Darl PikesSusan, AD assisted patient back to bed. Mom was educated on safe sleep, plan of care for tonight, and pain management. Mom asked that she gets the PRN medication when possible. RN educated mom on SCD, and mom refused. RN ordered dinner for patient and pt denies needing anything else. Patient said she will call if she needs assistance. Royston CowperIsley, Baruc Tugwell E, RN

## 2016-11-23 NOTE — Discharge Instructions (Signed)

## 2016-11-23 NOTE — Lactation Note (Signed)
This note was copied from a baby's chart. Lactation Consultation Note  [name] Interpreter used for Arabic [phone]#[id number]. Mother states her breasts are filling. Ex BF.  Baby sleeping.  Mother states she breastfed for approx 15 min recently. Provided mother w/ manual pump. Reviewed engorgement care and monitoring voids/stools. Mom encouraged to feed baby 8-12 times/24 hours and with feeding cues.  Denies problems or questions.  Patient Name: Michelle Patel EAVWU'JToday's Date: 11/23/2016 Reason for consult: Follow-up assessment   Maternal Data    Feeding Feeding Type: Breast Fed Length of feed: 15 min  LATCH Score/Interventions                      Lactation Tools Discussed/Used     Consult Status Consult Status: Complete    Hardie PulleyBerkelhammer, Asmi Fugere Boschen 11/23/2016, 12:20 PM

## 2016-11-23 NOTE — Discharge Summary (Signed)
OB Discharge Summary     Patient Name: Michelle Patel DOB: 30-Oct-1980 MRN: [id number]  Date of admission: 11/20/2016 Delivering MD: Shonna Chock BEDFORD   Date of discharge: 11/23/2016  Admitting diagnosis: [id number] Intrauterine pregnancy: [redacted]w[redacted]d     Secondary diagnosis:  Active Problems:   Previous cesarean section complicating pregnancy, antepartum condition or complication   Status post cesarean section  Additional problems: none     Discharge diagnosis: Term Pregnancy Delivered                                                                                                Post partum procedures:none  Augmentation: AROM  Complications: None  Hospital course:  Sceduled C/S   36 y.o. yo G4P4004 at [redacted]w[redacted]d was admitted to the hospital 11/20/2016 for scheduled cesarean section with the following indication:Elective Repeat.  Membrane Rupture Time/Date: 12:31 PM ,11/20/2016   Patient delivered a Viable infant.11/20/2016  Details of operation can be found in separate operative note.  Pateint had an uncomplicated postpartum course.  She is ambulating, tolerating a regular diet, passing flatus, and urinating well. Patient is discharged home in stable condition on  11/23/16         Physical exam  Vitals:   11/21/16 1755 11/22/16 0525 11/22/16 1837 11/23/16 0647  BP: 109/69 119/75 121/71 123/81  Pulse: 74 65 83 66  Resp: 18 18 18 16   Temp: 98.2 F (36.8 C) 97.8 F (36.6 C) 98.8 F (37.1 C) 97.9 F (36.6 C)  TempSrc: Oral Oral Oral Oral  SpO2:      Weight:      Height:       General: alert, cooperative and no distress Lochia: appropriate Uterine Fundus: firm Incision: Healing well with no significant drainage, No significant erythema, Dressing is clean, dry, and intact DVT Evaluation: No evidence of DVT seen on physical exam. Labs: Lab Results  Component Value Date   WBC 6.9 11/21/2016   HGB 9.8 (L) 11/21/2016   HCT 29.4 (L) 11/21/2016   MCV 85.7 11/21/2016   PLT 219 11/21/2016    No flowsheet data found.  Discharge instruction: per After Visit Summary and "Baby and Me Booklet".  After visit meds:  Allergies as of 11/23/2016      Reactions   Food Other (See Comments)   PATIENT DOES NOT EAT PORK      Medication List    STOP taking these medications   prenatal multivitamin Tabs tablet     TAKE these medications   docusate sodium 100 MG capsule Commonly known as:  COLACE Take 1 capsule (100 mg total) by mouth 2 (two) times daily.   ibuprofen 600 MG tablet Commonly known as:  ADVIL,MOTRIN Take 1 tablet (600 mg total) by mouth every 6 (six) hours as needed for mild pain or moderate pain.   oxyCODONE-acetaminophen 5-325 MG tablet Commonly known as:  PERCOCET/ROXICET Take 1-2 tablets by mouth every 4 (four) hours as needed (pain scale > 7).       Diet: routine diet  Activity: Advance as tolerated. Pelvic rest for 6 weeks.   Outpatient follow  up:6 weeks Follow up Appt:Future Appointments Date Time Provider Department Center  12/26/2016 10:40 AM Dorathy KinsmanVirginia Smith, CNM [facility]   Follow up Visit:No Follow-up on file.  Postpartum contraception: Undecided  Newborn Data: Live born female  Birth Weight: 6 lb 12.1 oz (3065 g) APGAR: 8, 9  Baby Feeding: Breast Disposition:home with mother   11/23/2016 Michelle Muscaaniel L Warden, MD  OB FELLOW DISCHARGE ATTESTATION  I have seen and examined this patient and agree with above documentation in the resident's note.   Ernestina PennaNicholas Jordin Vicencio, MD 9:53 AM

## 2016-12-26 ENCOUNTER — Encounter: Payer: Self-pay | Admitting: Medical

## 2016-12-26 ENCOUNTER — Ambulatory Visit (INDEPENDENT_AMBULATORY_CARE_PROVIDER_SITE_OTHER): Payer: Medicaid Other | Admitting: Medical

## 2016-12-26 DIAGNOSIS — Z3202 Encounter for pregnancy test, result negative: Secondary | ICD-10-CM

## 2016-12-26 DIAGNOSIS — N882 Stricture and stenosis of cervix uteri: Secondary | ICD-10-CM

## 2016-12-26 LAB — POCT PREGNANCY, URINE: Preg Test, Ur: NEGATIVE

## 2016-12-26 MED ORDER — MISOPROSTOL 200 MCG PO TABS: 400.0000 ug | ORAL_TABLET | Freq: Once | ORAL | 0 refills | Status: AC

## 2016-12-26 NOTE — Patient Instructions (Signed)

## 2016-12-26 NOTE — Progress Notes (Signed)
Subjective:     Michelle Patel is a 36 y.o. female who presents for a postpartum visit. She is 6 weeks postpartum following a RLTCS on 11/20/2016. I have fully reviewed the prenatal and intrapartum course. The delivery was at 39 gestational weeks. Outcome: low-transverse C-section. Anesthesia: spinal Postpartum course has been uncomplicated. Baby's course has been uncomplicated. Baby is feeding by breast/bottle. Bleeding not at this time. Bowel function is normal. Bladder function is normal. Patient is not sexually active. Contraception method is nothing at this time. Postpartum depression screening: negative  The following portions of the patient's history were reviewed and updated as appropriate: allergies, current medications, past family history, past medical history, past social history, past surgical history and problem list.  Review of Systems Pertinent items are noted in HPI.   Objective:    BP 120/74   Pulse (!) 55   Wt 217 lb (98.4 kg)   BMI 37.25 kg/m   General:  alert and cooperative   Breasts:  not performed  Lungs: clear to auscultation bilaterally  Heart:  regular rate and rhythm, S1, S2 normal, no murmur, click, rub or gallop  Abdomen: soft, non-tender; bowel sounds normal; no masses,  no organomegaly incision is well-healed   Vulva:  normal  Vagina: normal vaginal mucosa, mild erythema noted  Cervix:  no lesions, nulliparous appearance and normal contour, no lesions  Corpus: not examined  Adnexa:  not evaluated  Rectal Exam: Not performed.        Assessment:     Normal postpartum exam. Pap smear not done at today's visit.  IUD insertion, failed attempt Plan:    1. Contraception: abstinence 2. Rx for Cytotec given to patient to be taken the night before the next insertion attempt 3. Follow up in: a few days for another attempt at IUD insertion or sooner as needed.    Marny LowensteinJulie N Katheline Brendlinger, PA-C 12/26/2016 4:15 PM

## 2017-01-22 ENCOUNTER — Ambulatory Visit: Payer: Medicaid Other | Admitting: Medical

## 2017-03-22 NOTE — Addendum Note (Signed)
Addendum  created 03/22/17 0951 by Jonavin Seder, MD   Sign clinical note    

## 2017-08-28 IMAGING — US US MFM OB DETAIL+14 WK
1 series · 14 of 28 positions shown · non-contrast
Comparison: none

[Series 1: us mfm ob detail+14 wk · 79 acquisitions, 14 frames shown]
[im 3/79]
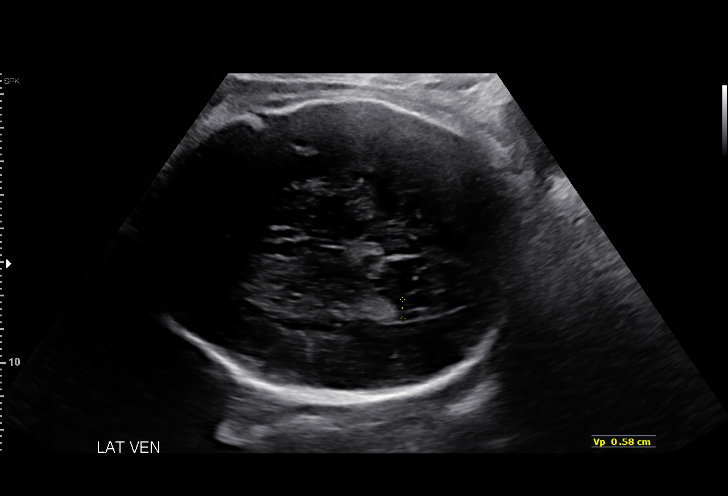
[im 9/79]
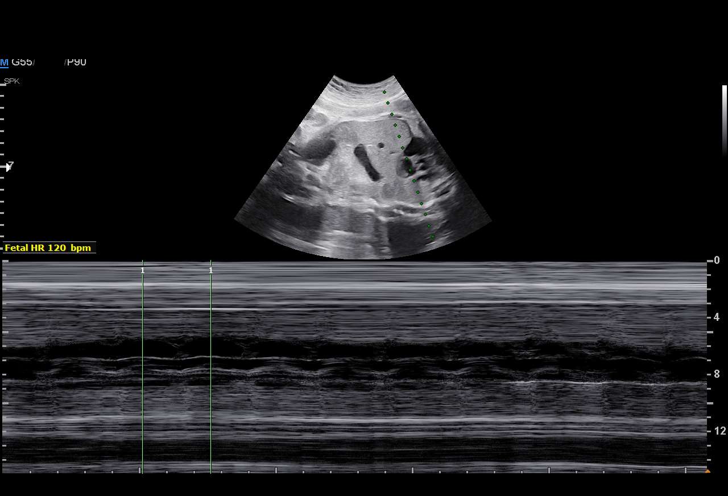
[im 15/79]
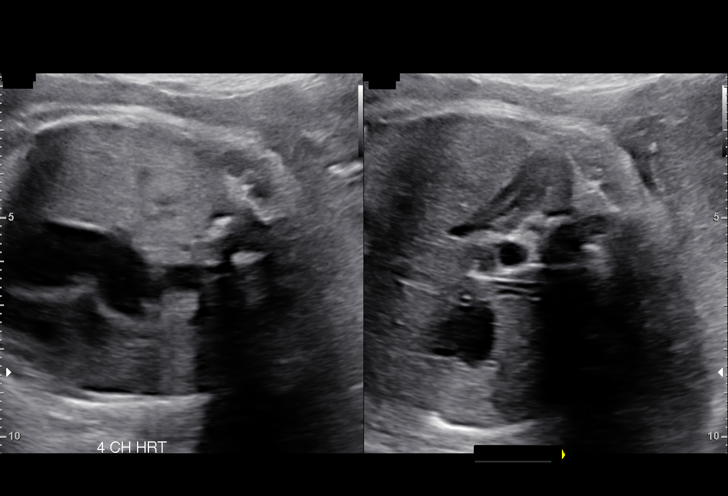
[im 21/79]
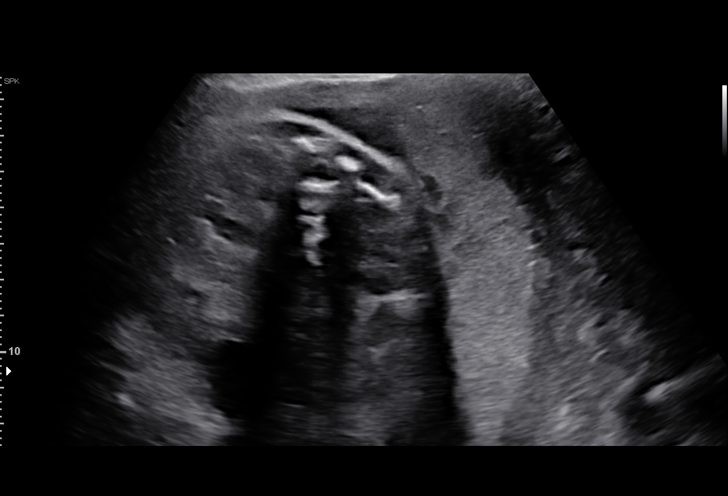
[im 27/79]
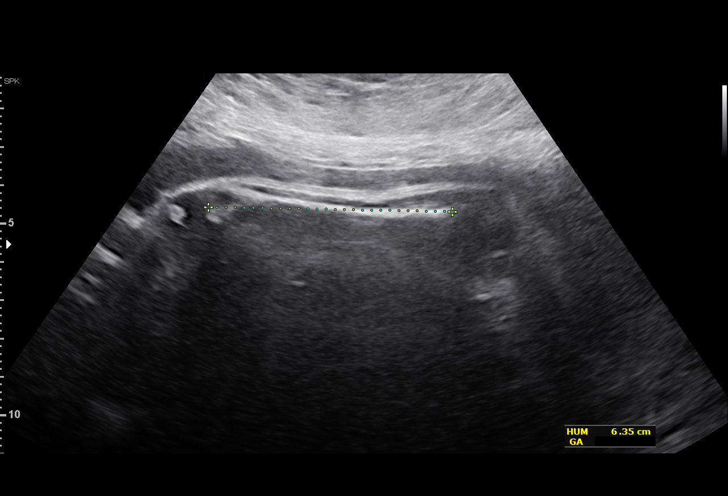
[im 32/79]
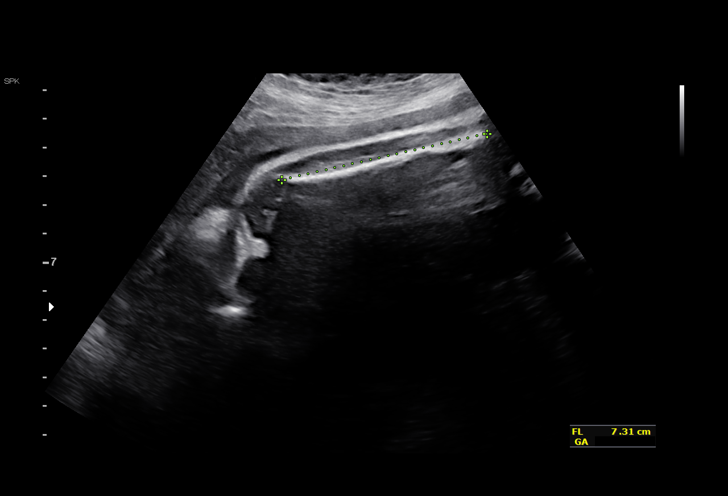
[im 38/79]
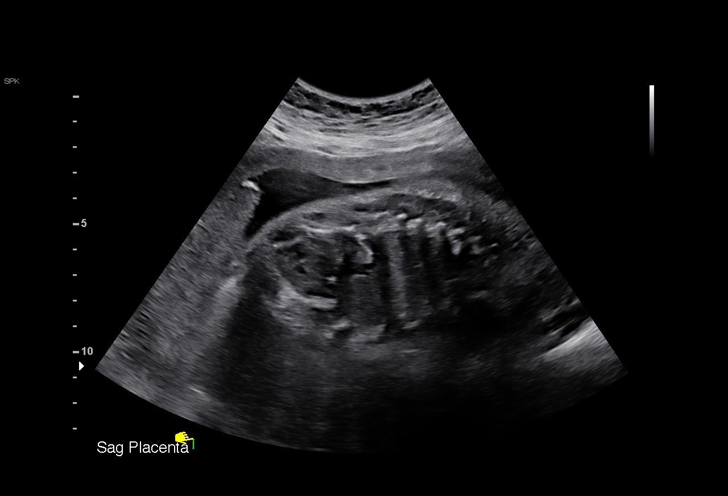
[im 44/79]
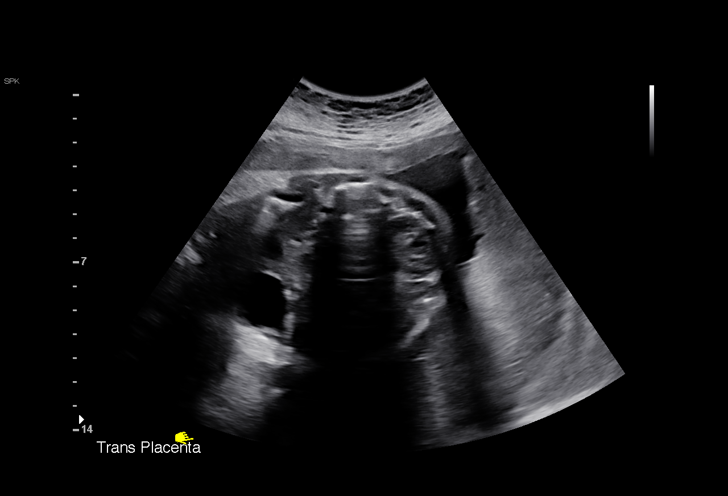
[im 50/79]
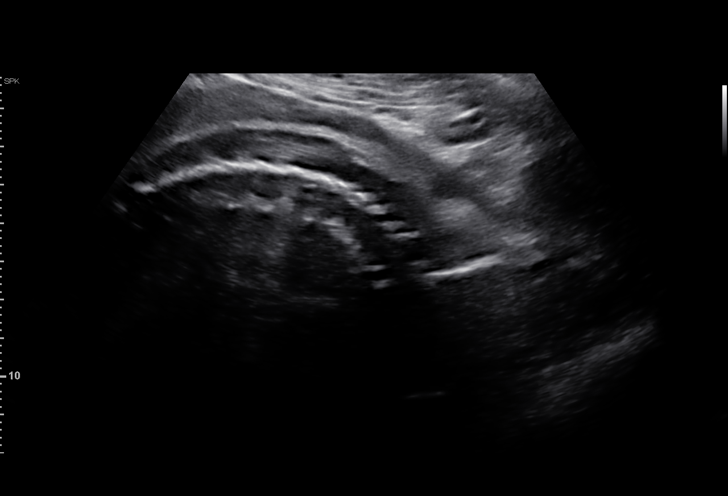
[im 55/79]
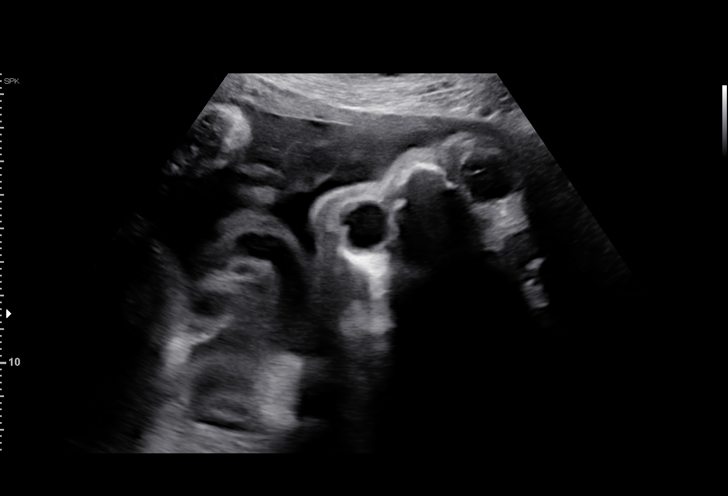
[im 61/79]
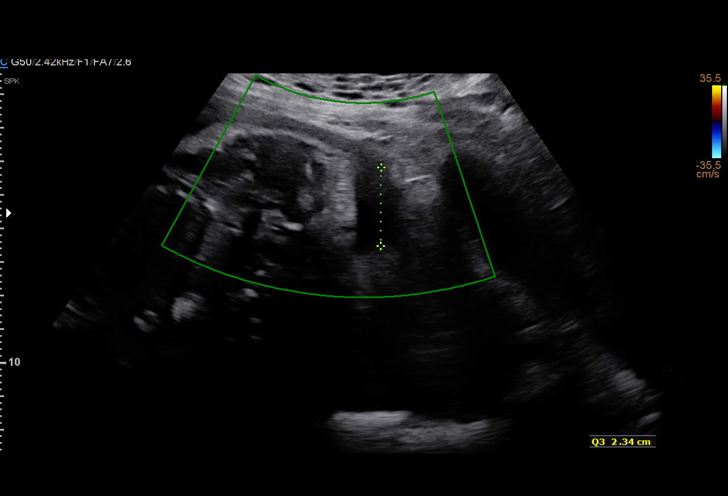
[im 67/79]
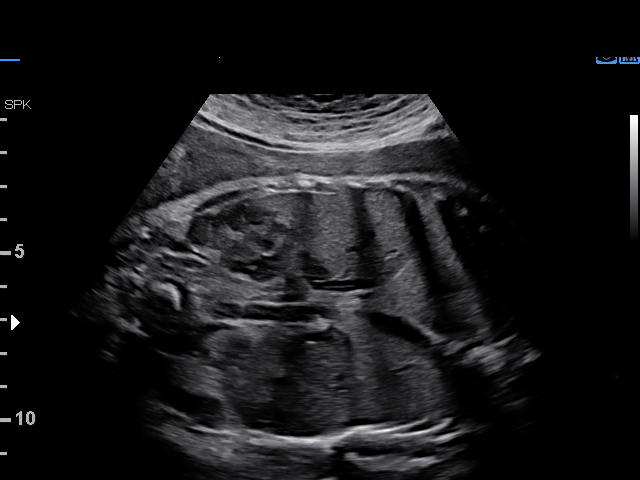
[im 73/79]
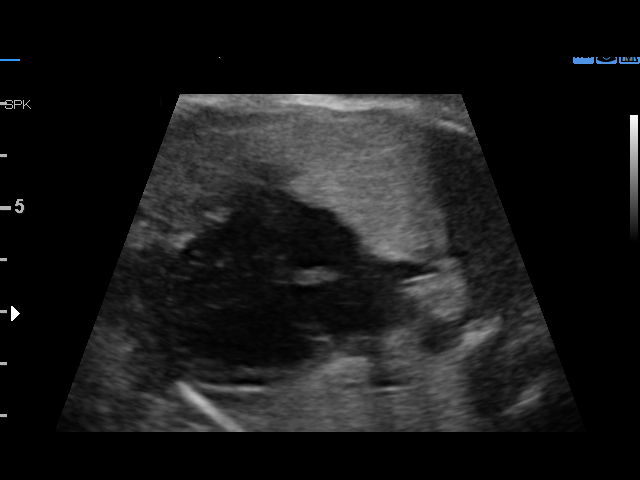
[im 79/79]
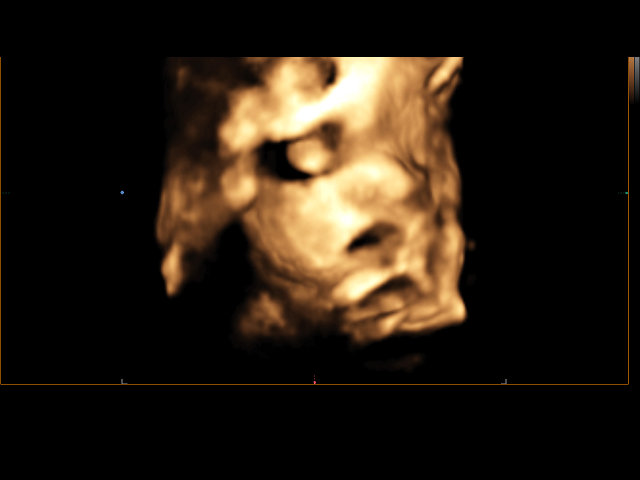

[14 of 28 positions shown; findings below may reference images not displayed]

1  SAVIO LOCKLEAR              [id number]      [id number]     [id number]
Indications

35 weeks gestation of pregnancy
Advanced maternal age multigravida 35+,
third trimester
Encounter for antenatal screening for
malformations
Previous cesarean delivery x 3, antepartum
OB History

Blood Type:            Height:         Weight (lb):  231      BMI:
Gravidity:    4         Term:   3        Prem:   0        SAB:   0
TOP:          0       Ectopic:  0        Living: 3
Fetal Evaluation

Num Of Fetuses:     1
Fetal Heart         120
Rate(bpm):
Cardiac Activity:   Observed
Presentation:       Cephalic
Placenta:           Posterior, above cervical os

Amniotic Fluid
AFI FV:      Subjectively within normal limits

AFI Sum(cm)     %Tile       Largest Pocket(cm)
11.48           32          4

RUQ(cm)       RLQ(cm)       LUQ(cm)        LLQ(cm)
2.19          2.95          4
Biometry

BPD:      88.9  mm     G. Age:  36w 0d         67  %    CI:        75.72   %   70 - 86
FL/HC:      22.1   %   20.1 -
HC:      323.9  mm     G. Age:  36w 5d         45  %    HC/AC:      1.04       0.93 -
AC:       310   mm     G. Age:  34w 6d         41  %    FL/BPD:     80.7   %   71 - 87
FL:       71.7  mm     G. Age:  36w 5d         74  %    FL/AC:      23.1   %   20 - 24
HUM:      64.5  mm     G. Age:  37w 3d       > 95  %

Est. FW:    8651  gm      6 lb 1 oz     66  %
Gestational Age

LMP:           35w 4d       Date:   02/19/16                 EDD:   11/25/16
U/S Today:     36w 1d                                        EDD:   11/21/16
Best:          35w 4d    Det. By:   LMP  (02/19/16)          EDD:   11/25/16
Anatomy

Cranium:               Appears normal         Aortic Arch:            Not well visualized
Cavum:                 Appears normal         Ductal Arch:            Not well visualized
Ventricles:            Appears normal         Diaphragm:              Appears normal
Choroid Plexus:        Not well visualized    Stomach:                Previously Seen
Cerebellum:            Not well visualized    Abdomen:                Appears normal
Posterior Fossa:       Not well visualized    Abdominal Wall:         Not well visualized
Nuchal Fold:           Not applicable (>20    Cord Vessels:           Appears normal (3
wks GA)                                        vessel cord)
Face:                  Appears normal         Kidneys:                Appear normal
(orbits and profile)
Lips:                  Not well visualized    Bladder:                Appears normal
Thoracic:              Appears normal         Spine:                  Ltd views no
intracranial signs of
NT
Heart:                 Appears normal         Upper Extremities:      Visualized
(4CH, axis, and situs
RVOT:                  Appears normal         Lower Extremities:      Visualized
LVOT:                  Appears normal

Other:  Appears to be female Difficult due to Late Gestation age and fetal
positieon
Cervix Uterus Adnexa

Cervix
Not visualized (advanced GA >07wks)

Uterus
No abnormality visualized.

Left Ovary
Not visualized.

Right Ovary
Not visualized.
Impression

SIUP at 35+4 weeks
Cephalic presentation
Normal detailed fetal anatomy; limited views of posterior
fossa, lip, arches and CI
Normal amniotic fluid volume
Measurements consistent with LMP dating; EFW at the 66th
%tile
Recommendations

Follow-up as clinically indicated
